# Patient Record
Sex: Female | Born: 1955
Health system: Southern US, Community
[De-identification: ages and names within clinical notes are randomized; demographics above are authoritative.]

## PROBLEM LIST (undated history)

## (undated) DIAGNOSIS — N8110 Cystocele, unspecified: Secondary | ICD-10-CM

## (undated) DIAGNOSIS — M1990 Unspecified osteoarthritis, unspecified site: Secondary | ICD-10-CM

## (undated) DIAGNOSIS — N393 Stress incontinence (female) (male): Secondary | ICD-10-CM

## (undated) DIAGNOSIS — Z973 Presence of spectacles and contact lenses: Secondary | ICD-10-CM

## (undated) DIAGNOSIS — N816 Rectocele: Secondary | ICD-10-CM

## (undated) HISTORY — PX: TONSILLECTOMY AND ADENOIDECTOMY: SUR1326

---

## 1999-11-15 ENCOUNTER — Other Ambulatory Visit: Admission: RE | Admit: 1999-11-15 | Discharge: 1999-11-15 | Payer: Self-pay | Admitting: Obstetrics and Gynecology

## 2001-01-13 ENCOUNTER — Other Ambulatory Visit: Admission: RE | Admit: 2001-01-13 | Discharge: 2001-01-13 | Payer: Self-pay | Admitting: Obstetrics and Gynecology

## 2002-01-14 ENCOUNTER — Other Ambulatory Visit: Admission: RE | Admit: 2002-01-14 | Discharge: 2002-01-14 | Payer: Self-pay | Admitting: Obstetrics and Gynecology

## 2003-02-10 ENCOUNTER — Other Ambulatory Visit: Admission: RE | Admit: 2003-02-10 | Discharge: 2003-02-10 | Payer: Self-pay | Admitting: Obstetrics and Gynecology

## 2004-03-07 ENCOUNTER — Other Ambulatory Visit: Admission: RE | Admit: 2004-03-07 | Discharge: 2004-03-07 | Payer: Self-pay | Admitting: Obstetrics and Gynecology

## 2009-03-10 DIAGNOSIS — I8390 Asymptomatic varicose veins of unspecified lower extremity: Secondary | ICD-10-CM

## 2009-03-10 HISTORY — PX: VARICOSE VEIN SURGERY: SHX832

## 2009-03-10 HISTORY — DX: Asymptomatic varicose veins of unspecified lower extremity: I83.90

## 2009-06-27 ENCOUNTER — Ambulatory Visit: Payer: Self-pay | Admitting: Vascular Surgery

## 2009-07-11 ENCOUNTER — Ambulatory Visit: Payer: Self-pay | Admitting: Vascular Surgery

## 2009-07-18 ENCOUNTER — Ambulatory Visit: Payer: Self-pay | Admitting: Vascular Surgery

## 2010-07-23 NOTE — Consult Note (Signed)
NEW PATIENT CONSULTATION   Hailey Smith, Hailey Smith  DOB:  08/06/1955                                       06/27/2009  ZOXWR#:[id number]   The patient presents today for evaluation of right leg venous  insufficiency and hypertension.  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  She works as a Psychologist, prison and probation services and stands for great  periods of time, which is difficult due to leg pain. She has had to  discontinue all exercise that requires a squatting position due to leg  pain. Hailey Smith also experiences severe leg pain when she drives  distances. She has to drive to [place], [place] to care for her father and  leg pain has made this situation difficult.  She has discomfort over  these varicosities, most particularly in the anterior thigh and her  lateral calf.  This is somewhat relieved with rest.  She had been seen  at an outlying vein center and had been compliant with graduated  compression stockings thigh high 30-40 mmHg for greater than 6 months.  She was out of network and we are seeing her for second opinion.  I also  have her prior duplex results and I reviewed these as well.  She does  have reflux in anterior branch of her right great saphenous vein that is  feeding directly into the varicosities.  I re-imaged this myself with  SonoSite and this does confirm the anterior saphenous branch feeding  into these varicosities.  She has had no bleeding from these.  She does  not have any history of deep venous thrombosis.  She reports that this  pain has been progressive.   PAST HISTORY:  Is negative for any major medical disorders, specifically  no hypertension or cardiac disease.   FAMILY HISTORY:  Negative for premature atherosclerotic disease.   SOCIAL HISTORY:  She is married with three children.  She does not smoke  or drink alcohol.   REVIEW OF SYSTEMS:  No weight loss or weight gain,  weight reported at  pounds 150 pounds, she is 5 feet 3 inches tall.  Denies cardiac,  pulmonary, GI, GU difficulties.  VASCULAR:  Positive only for her pain with her varicosities.  NEUROLOGIC:  MUSCULOSKELETAL:  PSYCHIATRIC:  Negative.  HEENT:  Positive for her being treated with glaucoma.  HEMATOLOGIC:  SKIN:  Negative.   PHYSICAL EXAM:  Well-developed, well-nourished white female appearing  stated age.  No acute distress.  Blood pressure is 132/88, pulse 74,  respirations 14.  HEENT is normal.  Her musculoskeletal shows no major  deformities or cyanosis.  Neurologic:  No focal weakness or  paresthesias.  Skin:  Without ulcers or rashes.  She does have a large  typical pattern of aberrant varicosities beginning in her medial groin  on the right extending across her anterior thigh to the lateral knee and  then down  the lateral calf.   I again discussed options with the patient and I have recommended laser  ablation of her anterior saphenous vein and stab phlebectomy of multiple  tributary varicosities.  I explained this as an outpatient procedure  under local anesthesia and she understands this and wishes to proceed.  She had been preapproved apparently in the past, although this was out  of network expense, therefore we  will assure insurance coverage and then  proceed at her convenience.     Larina Earthly, M.D.  Electronically Signed   TFE/MEDQ  D:  06/27/2009  T:  06/28/2009  Job:  [id number]   cc:   [facility] Carily[facility]past[facility]

## 2010-07-23 NOTE — Assessment & Plan Note (Signed)
OFFICE VISIT   Hailey Smith, Hailey Smith  DOB:  1956-02-16                                       07/11/2009  ZOXWR#:[id number]   The patient today underwent laser ablation of right anterior saphenous  vein from mid thigh to her groin and stab phlebectomy of multiple  tributary varicosities from her thigh, the lateral knee and calf.  She  had no immediate complication.  This was done under local anesthesia.  She will be seen again in 1 week for continued followup and repeat  ultrasound.     Larina Earthly, M.D.  Electronically Signed   TFE/MEDQ  D:  07/11/2009  T:  07/12/2009  Job:  [id number]

## 2010-07-23 NOTE — Assessment & Plan Note (Signed)
OFFICE VISIT   SEFORA, TIETJE  DOB:  Mar 01, 1956                                       07/18/2009  NFAOZ#:[id number]   Hailey Smith presents today for 1-week follow-up of her laser ablation  of anterior branch of her great saphenous vein on the right and stab  phlebectomy of multiple tributaries varicosities throughout her thigh,  lateral knee area and calf area.  She has excellent initial result with  minimal if any discomfort throughout the course of her procedure.  She  does have the usual amount of mild bruising over the phlebectomy sites.  She underwent repeat venous duplex today and this shows no evidence of  the injury to the deep veins.  Her anterior saphenous branch in his  occluded to the saphenofemoral junction.  I am quite pleased with her  initial result as is [name].  She will continue usual activities,  wear compression garments for 1 more week and will see [place] again on an as-  needed basis.     Larina Earthly, M.D.  Electronically Signed   TFE/MEDQ  D:  07/18/2009  T:  07/19/2009  Job:  [id number]

## 2010-07-23 NOTE — Procedures (Signed)
DUPLEX DEEP VENOUS EXAM - LOWER EXTREMITY   INDICATION:  One week post right anterolateral laser with phlebectomies.   HISTORY:  Edema:  No.  Trauma/Surgery:  One week post anterolateral tributary laser with  phlebectomies.  Pain:  No.  PE:  No.  Previous DVT:  No.  Anticoagulants:  No.  Other:   DUPLEX EXAM:                CFV   SFV   PopV  PTV    GSV                R  L  R  L  R  L  R   L  R  L  Thrombosis    o  o  o     o     o      o  Spontaneous   +  +  +     +     +      +  Phasic        +  +  +     +     +      +  Augmentation  +  +  +     +     +      +  Compressible  +  +  +     +     +      +  Competent     +  +  +     +     +      +   Legend:  + - yes  o - no  p - partial  D - decreased   IMPRESSION:  1. The right leg is negative for deep venous thrombosis.  2. The right anterolateral tributary is closed off and the area of APs      are also closed off due to laser procedure.    _____________________________  Larina Earthly, M.D.   NT/MEDQ  D:  07/18/2009  T:  07/18/2009  Job:  [id number]

## 2013-11-21 ENCOUNTER — Encounter: Payer: Self-pay | Admitting: Podiatry

## 2013-11-21 ENCOUNTER — Ambulatory Visit (INDEPENDENT_AMBULATORY_CARE_PROVIDER_SITE_OTHER): Payer: BC Managed Care – PPO | Admitting: Podiatry

## 2013-11-21 VITALS — BP 117/75 | HR 89 | Resp 18

## 2013-11-21 DIAGNOSIS — Q828 Other specified congenital malformations of skin: Secondary | ICD-10-CM

## 2013-11-21 NOTE — Progress Notes (Signed)
   Subjective:    Patient ID: Hailey Smith, female    DOB: [birth year], 58 y.o.   MRN: [id number]  HPI DR [facility] TRIMS ON MY CALLUSES AND THEY ARE SORE AND TENDER AND THEY HURT WHEN I WALK AND THERE IS NO BURNING AND THROBBING. This patient presents for ongoing care for painful plantar keratoses. She has been a patient in this practice since 10/25/1999. The last visit for the service was 05/19/2012.   Review of Systems  Allergic/Immunologic: Positive for environmental allergies.  All other systems reviewed and are negative.      Objective:   Physical Exam Orientated x3 white female  Nucleated keratoses more prominent plantar right and left x4        Assessment & Plan:   Assessment: Porokeratosis x4  Plan: Keratoses are debrided in the plantar left packed with salinocaine  Reappoint at patient's request

## 2015-07-17 DIAGNOSIS — R300 Dysuria: Secondary | ICD-10-CM | POA: Diagnosis not present

## 2015-09-04 DIAGNOSIS — H524 Presbyopia: Secondary | ICD-10-CM | POA: Diagnosis not present

## 2015-12-28 DIAGNOSIS — J208 Acute bronchitis due to other specified organisms: Secondary | ICD-10-CM | POA: Diagnosis not present

## 2016-01-07 DIAGNOSIS — J209 Acute bronchitis, unspecified: Secondary | ICD-10-CM | POA: Diagnosis not present

## 2016-01-11 ENCOUNTER — Ambulatory Visit
Admission: RE | Admit: 2016-01-11 | Discharge: 2016-01-11 | Disposition: A | Discharge status: Home / Self Care | Payer: BLUE CROSS/BLUE SHIELD | Source: Ambulatory Visit | Attending: Family Medicine | Admitting: Family Medicine

## 2016-01-11 ENCOUNTER — Other Ambulatory Visit: Payer: Self-pay | Admitting: Family Medicine

## 2016-01-11 DIAGNOSIS — R05 Cough: Secondary | ICD-10-CM | POA: Diagnosis not present

## 2016-01-11 DIAGNOSIS — R059 Cough, unspecified: Secondary | ICD-10-CM

## 2016-01-11 DIAGNOSIS — R062 Wheezing: Secondary | ICD-10-CM | POA: Diagnosis not present

## 2016-01-23 DIAGNOSIS — Z130 Encounter for screening for diseases of the blood and blood-forming organs and certain disorders involving the immune mechanism: Secondary | ICD-10-CM | POA: Diagnosis not present

## 2016-01-23 DIAGNOSIS — Z01419 Encounter for gynecological examination (general) (routine) without abnormal findings: Secondary | ICD-10-CM | POA: Diagnosis not present

## 2016-01-23 DIAGNOSIS — Z23 Encounter for immunization: Secondary | ICD-10-CM | POA: Diagnosis not present

## 2016-01-23 DIAGNOSIS — Z1389 Encounter for screening for other disorder: Secondary | ICD-10-CM | POA: Diagnosis not present

## 2016-01-23 DIAGNOSIS — N393 Stress incontinence (female) (male): Secondary | ICD-10-CM | POA: Diagnosis not present

## 2016-01-23 DIAGNOSIS — Z6829 Body mass index (BMI) 29.0-29.9, adult: Secondary | ICD-10-CM | POA: Diagnosis not present

## 2016-01-23 DIAGNOSIS — Z1231 Encounter for screening mammogram for malignant neoplasm of breast: Secondary | ICD-10-CM | POA: Diagnosis not present

## 2016-01-23 DIAGNOSIS — R1904 Left lower quadrant abdominal swelling, mass and lump: Secondary | ICD-10-CM | POA: Diagnosis not present

## 2016-01-28 ENCOUNTER — Other Ambulatory Visit: Payer: Self-pay | Admitting: Obstetrics and Gynecology

## 2016-01-28 DIAGNOSIS — R1904 Left lower quadrant abdominal swelling, mass and lump: Secondary | ICD-10-CM

## 2016-02-04 ENCOUNTER — Ambulatory Visit
Admission: RE | Admit: 2016-02-04 | Discharge: 2016-02-04 | Disposition: A | Discharge status: Home / Self Care | Payer: BLUE CROSS/BLUE SHIELD | Source: Ambulatory Visit | Attending: Obstetrics and Gynecology | Admitting: Obstetrics and Gynecology

## 2016-02-04 ENCOUNTER — Other Ambulatory Visit: Payer: Self-pay | Admitting: Family Medicine

## 2016-02-04 DIAGNOSIS — R1904 Left lower quadrant abdominal swelling, mass and lump: Secondary | ICD-10-CM

## 2016-02-04 DIAGNOSIS — R1909 Other intra-abdominal and pelvic swelling, mass and lump: Secondary | ICD-10-CM | POA: Diagnosis not present

## 2016-02-06 ENCOUNTER — Other Ambulatory Visit: Payer: Self-pay | Admitting: Family Medicine

## 2016-02-06 ENCOUNTER — Ambulatory Visit
Admission: RE | Admit: 2016-02-06 | Discharge: 2016-02-06 | Disposition: A | Discharge status: Home / Self Care | Payer: BLUE CROSS/BLUE SHIELD | Source: Ambulatory Visit | Attending: Family Medicine | Admitting: Family Medicine

## 2016-02-06 DIAGNOSIS — J189 Pneumonia, unspecified organism: Secondary | ICD-10-CM | POA: Diagnosis not present

## 2016-02-06 DIAGNOSIS — R059 Cough, unspecified: Secondary | ICD-10-CM

## 2016-02-06 DIAGNOSIS — R05 Cough: Secondary | ICD-10-CM

## 2016-03-14 ENCOUNTER — Ambulatory Visit
Admission: RE | Admit: 2016-03-14 | Discharge: 2016-03-14 | Disposition: A | Discharge status: Home / Self Care | Payer: BLUE CROSS/BLUE SHIELD | Source: Ambulatory Visit | Attending: Family Medicine | Admitting: Family Medicine

## 2016-03-14 ENCOUNTER — Other Ambulatory Visit: Payer: Self-pay | Admitting: Family Medicine

## 2016-03-14 DIAGNOSIS — J40 Bronchitis, not specified as acute or chronic: Secondary | ICD-10-CM

## 2016-03-14 DIAGNOSIS — R05 Cough: Secondary | ICD-10-CM | POA: Diagnosis not present

## 2016-05-23 DIAGNOSIS — J069 Acute upper respiratory infection, unspecified: Secondary | ICD-10-CM | POA: Diagnosis not present

## 2016-09-17 DIAGNOSIS — H1045 Other chronic allergic conjunctivitis: Secondary | ICD-10-CM | POA: Diagnosis not present

## 2016-12-11 DIAGNOSIS — Z23 Encounter for immunization: Secondary | ICD-10-CM | POA: Diagnosis not present

## 2017-02-11 DIAGNOSIS — Z6830 Body mass index (BMI) 30.0-30.9, adult: Secondary | ICD-10-CM | POA: Diagnosis not present

## 2017-02-11 DIAGNOSIS — N951 Menopausal and female climacteric states: Secondary | ICD-10-CM | POA: Diagnosis not present

## 2017-02-11 DIAGNOSIS — Z01419 Encounter for gynecological examination (general) (routine) without abnormal findings: Secondary | ICD-10-CM | POA: Diagnosis not present

## 2017-02-11 DIAGNOSIS — Z1231 Encounter for screening mammogram for malignant neoplasm of breast: Secondary | ICD-10-CM | POA: Diagnosis not present

## 2017-02-11 DIAGNOSIS — Z130 Encounter for screening for diseases of the blood and blood-forming organs and certain disorders involving the immune mechanism: Secondary | ICD-10-CM | POA: Diagnosis not present

## 2017-02-11 DIAGNOSIS — R3129 Other microscopic hematuria: Secondary | ICD-10-CM | POA: Diagnosis not present

## 2017-02-11 DIAGNOSIS — Z1389 Encounter for screening for other disorder: Secondary | ICD-10-CM | POA: Diagnosis not present

## 2017-02-13 DIAGNOSIS — R3129 Other microscopic hematuria: Secondary | ICD-10-CM | POA: Diagnosis not present

## 2017-05-20 DIAGNOSIS — J209 Acute bronchitis, unspecified: Secondary | ICD-10-CM | POA: Diagnosis not present

## 2017-06-08 DIAGNOSIS — J209 Acute bronchitis, unspecified: Secondary | ICD-10-CM | POA: Diagnosis not present

## 2017-06-08 DIAGNOSIS — R030 Elevated blood-pressure reading, without diagnosis of hypertension: Secondary | ICD-10-CM | POA: Diagnosis not present

## 2017-06-25 ENCOUNTER — Encounter: Payer: Self-pay | Admitting: Podiatry

## 2017-06-25 ENCOUNTER — Ambulatory Visit: Payer: BLUE CROSS/BLUE SHIELD

## 2017-06-25 ENCOUNTER — Ambulatory Visit: Payer: BLUE CROSS/BLUE SHIELD | Admitting: Podiatry

## 2017-06-25 DIAGNOSIS — S90852A Superficial foreign body, left foot, initial encounter: Secondary | ICD-10-CM | POA: Diagnosis not present

## 2017-06-25 DIAGNOSIS — N951 Menopausal and female climacteric states: Secondary | ICD-10-CM | POA: Insufficient documentation

## 2017-06-25 DIAGNOSIS — R319 Hematuria, unspecified: Secondary | ICD-10-CM | POA: Insufficient documentation

## 2017-06-25 DIAGNOSIS — R300 Dysuria: Secondary | ICD-10-CM | POA: Insufficient documentation

## 2017-06-25 DIAGNOSIS — B351 Tinea unguium: Secondary | ICD-10-CM | POA: Diagnosis not present

## 2017-06-25 MED ORDER — TERBINAFINE HCL 250 MG PO TABS: 250.0000 mg | ORAL_TABLET | Freq: Every day | ORAL | 0 refills | Status: DC

## 2017-06-25 NOTE — Progress Notes (Signed)
Subjective:   Patient ID: Hailey Smith, female   DOB: 62 y.o.   MRN: [id number]   HPI Patient presents with discoloration of the hallux nails bilateral and a small area of trauma in the left plantar heel.  Patient does not smoke likes to be active and states the nails are discolored over the last year   Review of Systems  All other systems reviewed and are negative.       Objective:  Physical Exam  Constitutional: She appears well-developed and well-nourished.  Cardiovascular: Intact distal pulses.  Pulmonary/Chest: Effort normal.  Musculoskeletal: Normal range of motion.  Neurological: She is alert.  Skin: Skin is warm.  Nursing note and vitals reviewed.   Neurovascular status intact muscle strength is adequate range of motion within normal limits with patient found to have moderate discoloration of the distal two thirds of the nailbed right and one third distal nailbed left with a small trauma spot left heel that is localized with no erythema edema or drainage noted     Assessment:  Probability for moderate mycotic nail infection of the hallux bilateral with traumatized left heel with no indications of infection     Plan:  H&P conditions reviewed and at this point I recommended topical medicines laser and oral medicine and explained the risk of oral and I am ordering liver function study.  Patient is placed on oral medicine and Lamisil 250 for 90 days and is scheduled for her first laser will have 3-4 laser treatments.  Hallux nails bilateral

## 2017-06-29 ENCOUNTER — Ambulatory Visit: Payer: Self-pay

## 2017-06-29 DIAGNOSIS — B351 Tinea unguium: Secondary | ICD-10-CM | POA: Diagnosis not present

## 2017-06-29 LAB — HEPATIC FUNCTION PANEL
AG Ratio: 1.6 (calc) (ref 1.0–2.5)
ALT: 13 U/L (ref 6–29)
AST: 17 U/L (ref 10–35)
Albumin: 4.5 g/dL (ref 3.6–5.1)
Alkaline phosphatase (APISO): 59 U/L (ref 33–130)
Bilirubin, Direct: 0.2 mg/dL (ref 0.0–0.2)
Globulin: 2.9 g/dL (calc) (ref 1.9–3.7)
Indirect Bilirubin: 0.4 mg/dL (calc) (ref 0.2–1.2)
Total Bilirubin: 0.6 mg/dL (ref 0.2–1.2)
Total Protein: 7.4 g/dL (ref 6.1–8.1)

## 2017-07-07 NOTE — Progress Notes (Signed)
Pt presents with mycotic infection of nails 1-5 bilateral  All other systems are negative  Laser therapy administered to affected nails and tolerated well. All safety precautions were in place. RE-appointed in 4 weeks for 2nd treatment 

## 2017-08-04 ENCOUNTER — Ambulatory Visit: Payer: Self-pay

## 2017-08-04 DIAGNOSIS — B351 Tinea unguium: Secondary | ICD-10-CM

## 2017-08-11 NOTE — Progress Notes (Signed)
Pt presents with mycotic infection of nails 1-5 bilateral  All other systems are negative  Laser therapy administered to affected nails and tolerated well. All safety precautions were in place. RE-appointed in 4 weeks for 3rd treatment 

## 2017-08-18 ENCOUNTER — Telehealth: Payer: Self-pay | Admitting: Podiatry

## 2017-08-18 NOTE — Telephone Encounter (Signed)
I'm being treated for toenail fungus and was prescribed a 3 month supply of the terbinafine. I've only taken 50 pills and within the last week or so I've started experiencing a rash. I know that can be a side affect. Its not horrible but it does itch. Also, I wanted to know the protocol for was for another blood test for liver functions because I know that's a concern. My best call back number is [phone].

## 2017-08-18 NOTE — Telephone Encounter (Signed)
I informed pt the hives was more of an allergy type reaction than an indicator of liver problem, to stop the lamisil and I would call again with other options.

## 2017-08-19 NOTE — Telephone Encounter (Signed)
Consider 1 diflucan a week for 8 weeks

## 2017-08-20 NOTE — Telephone Encounter (Signed)
I informed pt of Dr. Beverlee Nimsegal's change to Diflucan recommendation. Pt states she has another appt for laser and would like to see how the nails look at that time. I told pt that was fine, to tell Shanda BumpsJessica the other nurse and she would be able to send the orders to her pharmacy or inform me. Pt states understanding.

## 2017-08-20 NOTE — Telephone Encounter (Signed)
Left message on pt's mobile phone to call for information concerning change of medication.

## 2017-09-04 ENCOUNTER — Other Ambulatory Visit: Payer: BLUE CROSS/BLUE SHIELD

## 2017-10-08 DIAGNOSIS — R300 Dysuria: Secondary | ICD-10-CM | POA: Diagnosis not present

## 2017-10-09 ENCOUNTER — Other Ambulatory Visit: Payer: BLUE CROSS/BLUE SHIELD

## 2017-10-09 DIAGNOSIS — R300 Dysuria: Secondary | ICD-10-CM | POA: Diagnosis not present

## 2017-12-03 DIAGNOSIS — Z23 Encounter for immunization: Secondary | ICD-10-CM | POA: Diagnosis not present

## 2017-12-07 DIAGNOSIS — H2513 Age-related nuclear cataract, bilateral: Secondary | ICD-10-CM | POA: Diagnosis not present

## 2017-12-07 DIAGNOSIS — H1045 Other chronic allergic conjunctivitis: Secondary | ICD-10-CM | POA: Diagnosis not present

## 2018-02-16 DIAGNOSIS — Z1231 Encounter for screening mammogram for malignant neoplasm of breast: Secondary | ICD-10-CM | POA: Diagnosis not present

## 2018-02-16 DIAGNOSIS — Z130 Encounter for screening for diseases of the blood and blood-forming organs and certain disorders involving the immune mechanism: Secondary | ICD-10-CM | POA: Diagnosis not present

## 2018-02-16 DIAGNOSIS — Z1389 Encounter for screening for other disorder: Secondary | ICD-10-CM | POA: Diagnosis not present

## 2018-02-16 DIAGNOSIS — Z6830 Body mass index (BMI) 30.0-30.9, adult: Secondary | ICD-10-CM | POA: Diagnosis not present

## 2018-02-16 DIAGNOSIS — Z01419 Encounter for gynecological examination (general) (routine) without abnormal findings: Secondary | ICD-10-CM | POA: Diagnosis not present

## 2018-03-10 HISTORY — PX: CATARACT EXTRACTION W/ INTRAOCULAR LENS IMPLANT: SHX1309

## 2018-03-10 HISTORY — PX: EYE SURGERY: SHX253

## 2018-03-23 DIAGNOSIS — C44311 Basal cell carcinoma of skin of nose: Secondary | ICD-10-CM | POA: Diagnosis not present

## 2018-10-06 IMAGING — US US PELVIS LIMITED
1 series · 13 of 13 positions shown · non-contrast
Comparison: None.

CLINICAL DATA: Left groin mass

EXAM:
ULTRASOUND OF BILATERAL GROIN SOFT TISSUES
TECHNIQUE: Ultrasound examination of the groin soft tissues was performed in
the area of clinical concern.

[Series 1: us pelvis limited · 0.08mm/px · 13 of 13 slices shown]
[im 1/13]
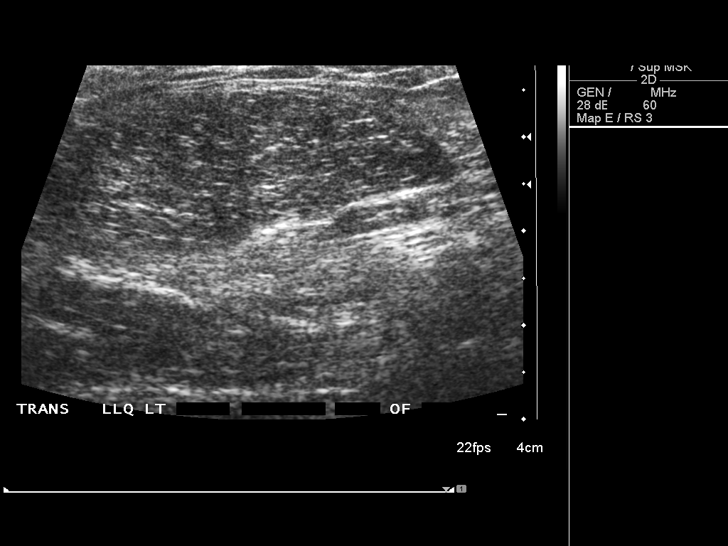
[im 2/13]
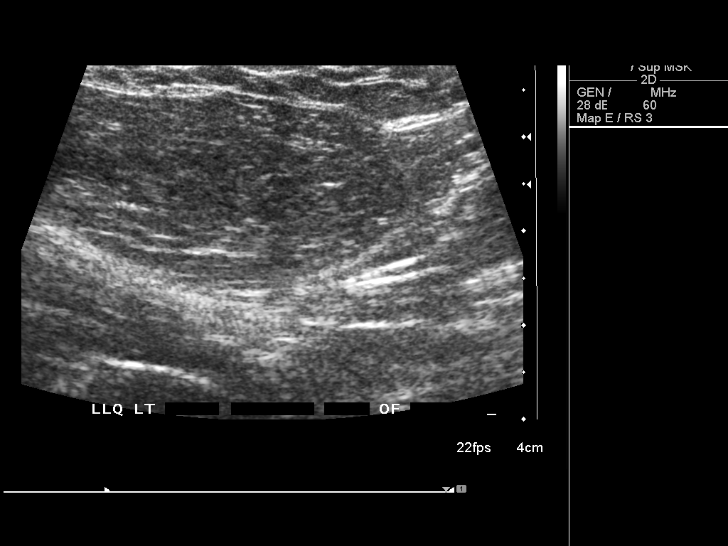
[im 3/13]
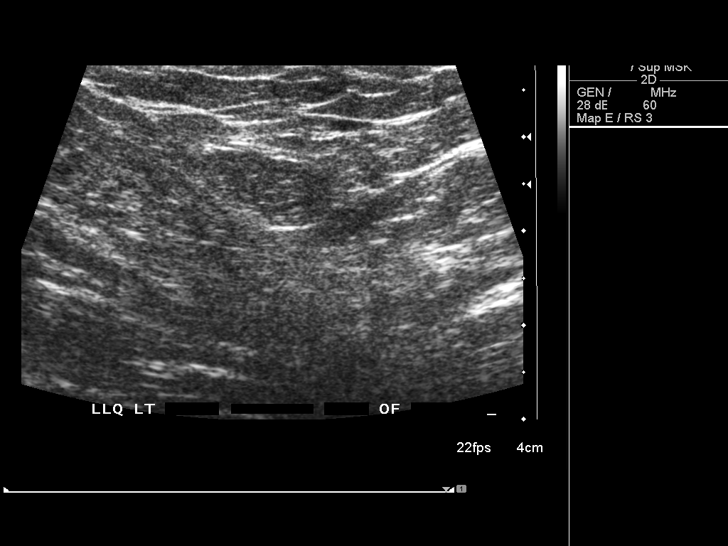
[im 4/13]
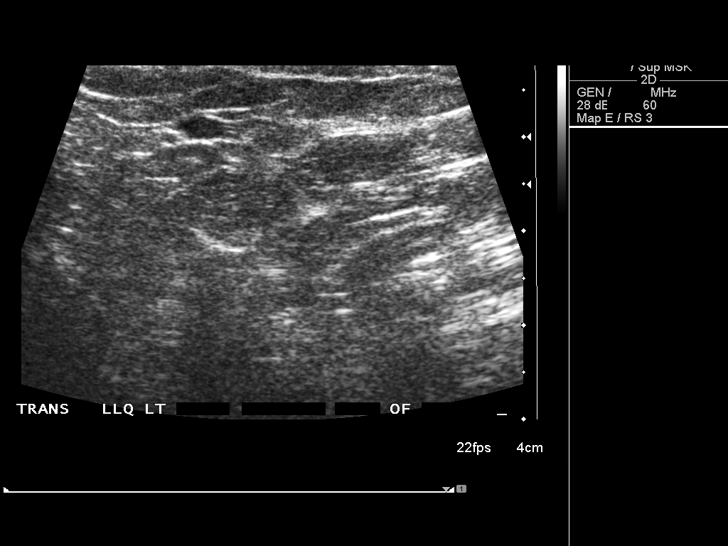
[im 5/13]
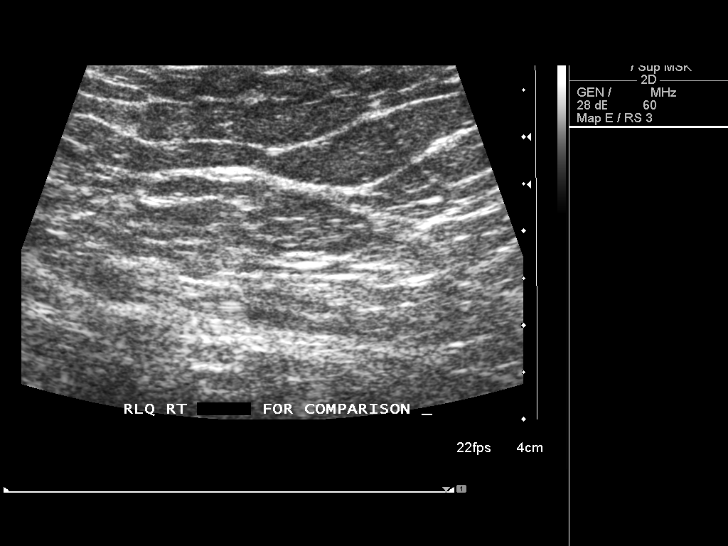
[im 6/13]
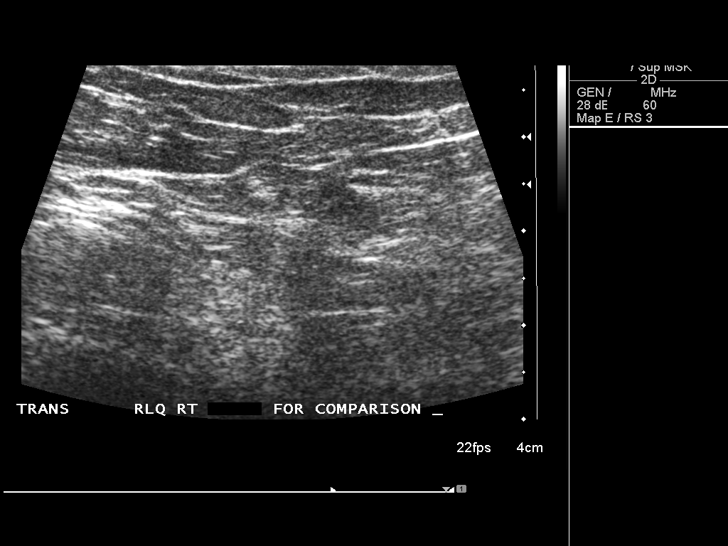
[im 7/13]
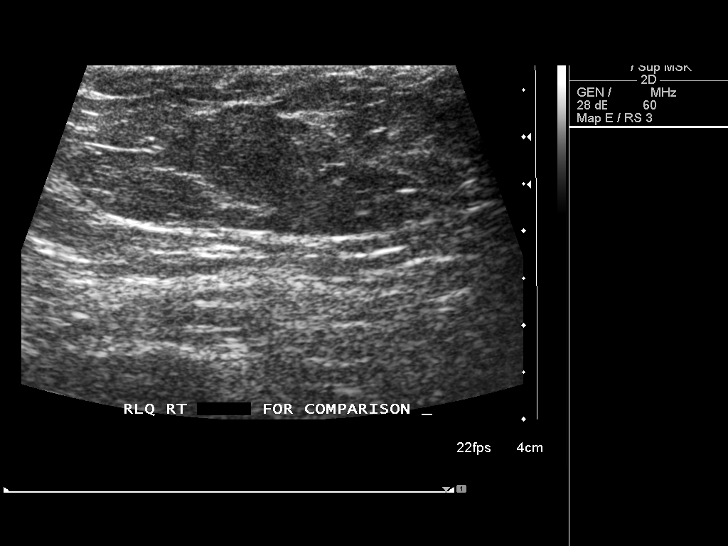
[im 8/13]
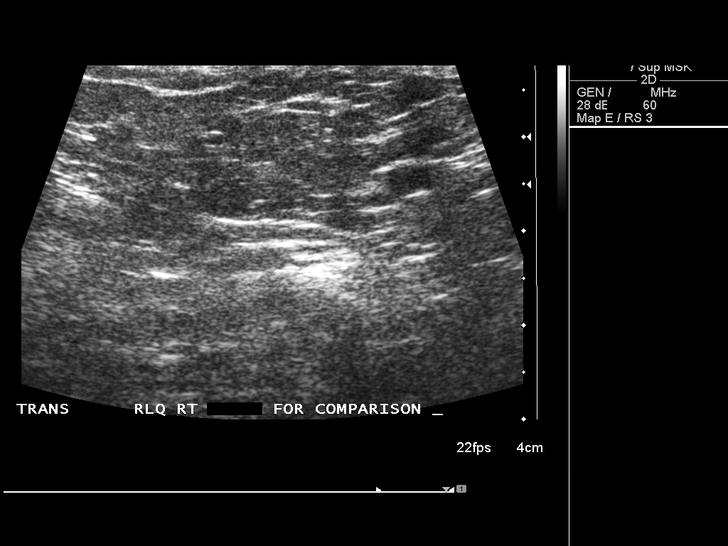
[im 9/13]
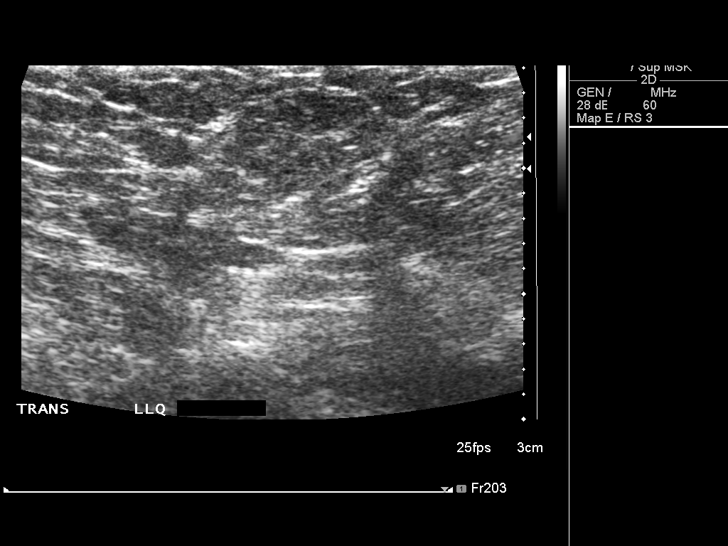
[im 10/13]
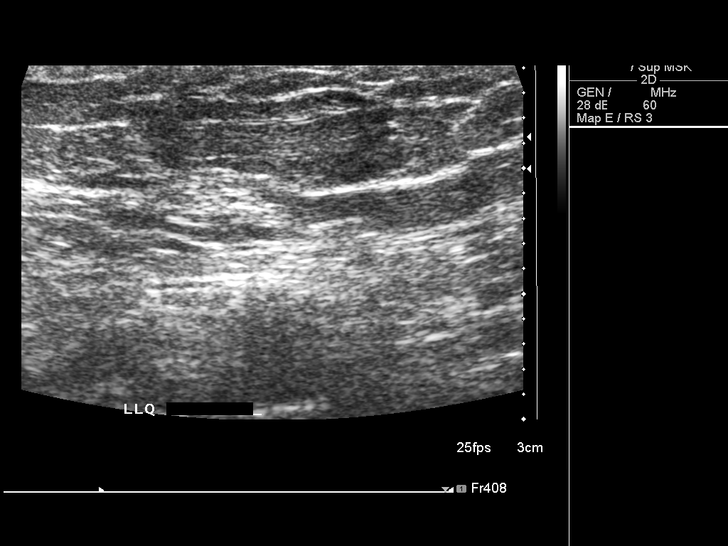
[im 11/13]
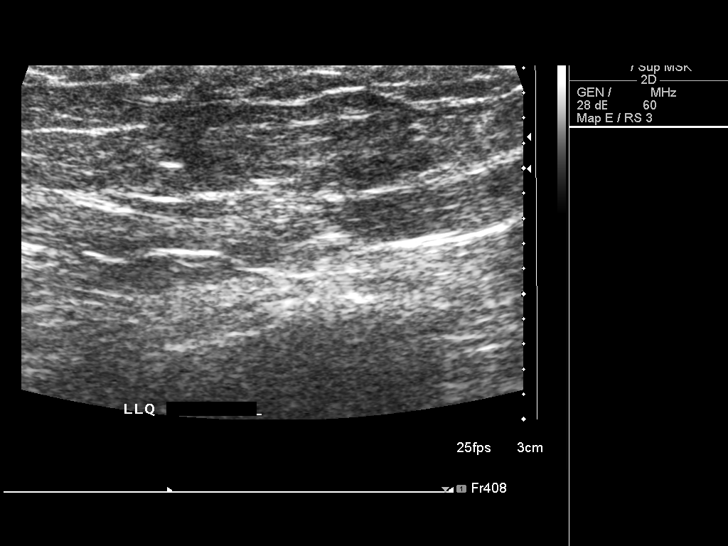
[im 12/13]
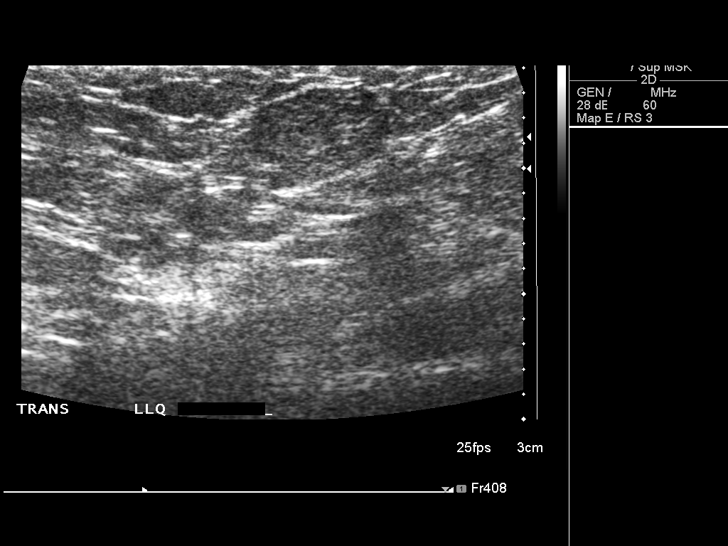
[im 13/13]
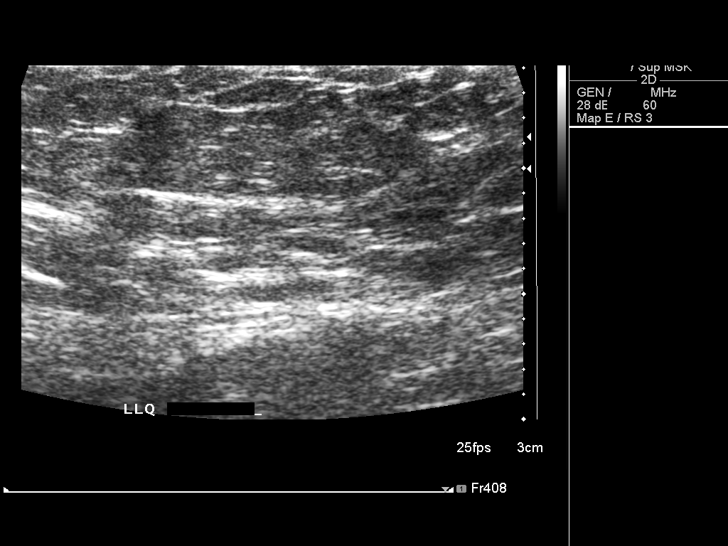

[13 of 13 positions shown; findings below may reference images not displayed]

FINDINGS: Sonographic imaging of the left groin was performed. No discrete
mass or fluid collection is noted. There is no lymphadenopathy. The
contralateral right groin was also imaged for comparison and is
similar in appearance.
IMPRESSION: No discrete mass or fluid collection in the left groin. No
lymphadenopathy.

Unremarkable appearance of the right groin imaged for comparison.

## 2018-10-08 IMAGING — CR DG CHEST W/ LORDOTIC
3 series · 3 of 3 positions shown · non-contrast
Comparison: 01/11/2016 .

CLINICAL DATA: Pneumonia .

EXAM:
CHEST - 1 VIEW WITH LORDOTIC VIEW(S)

[w chest pa (1 of 2)]
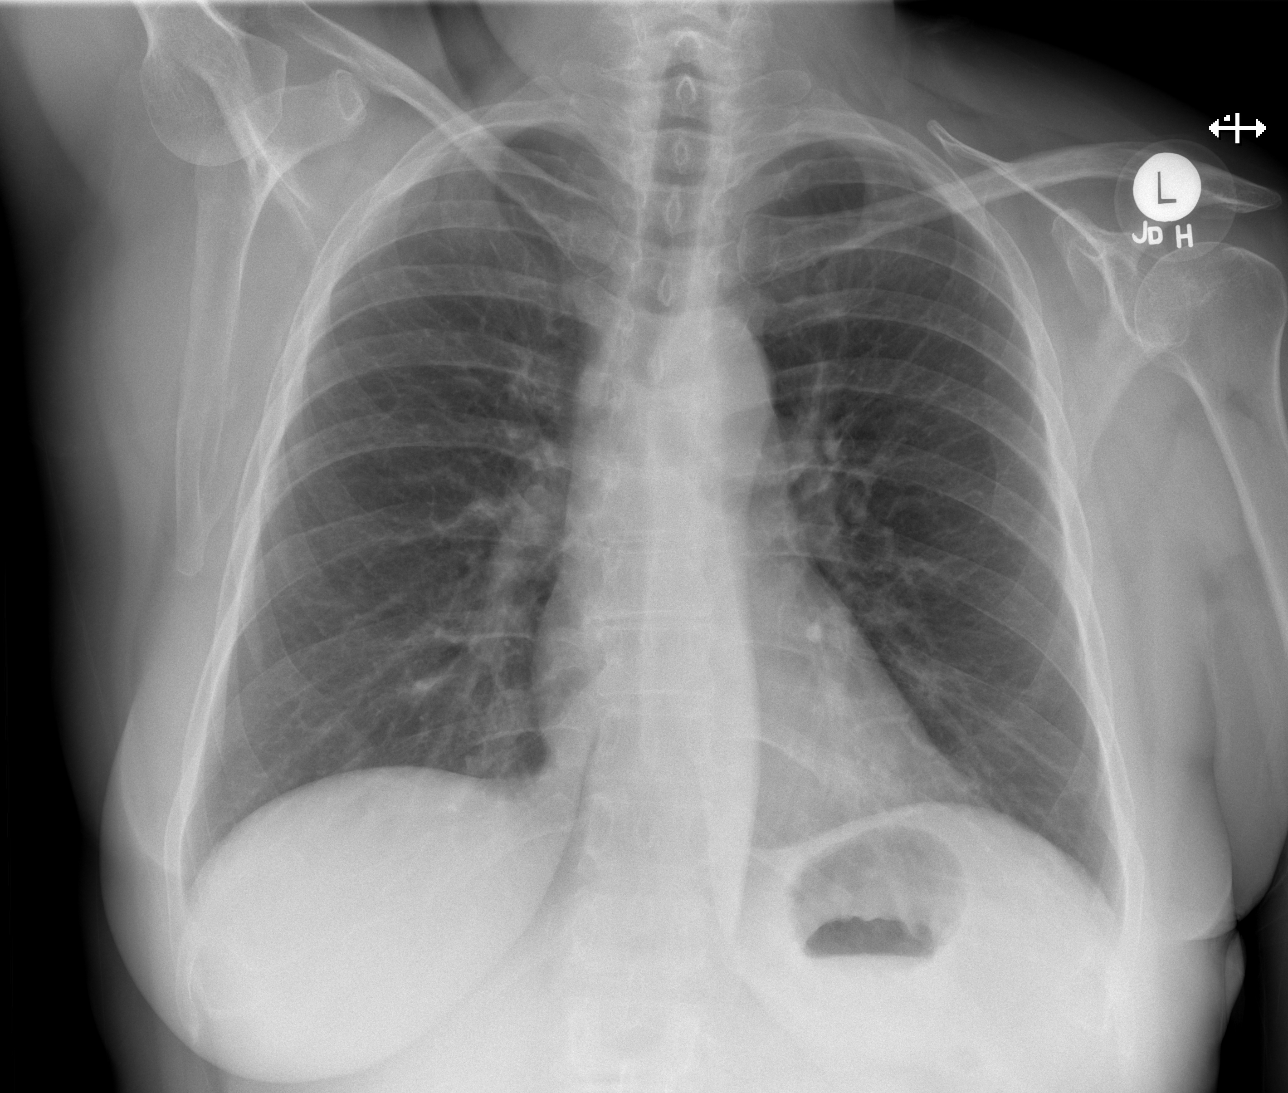

[w chest lat]
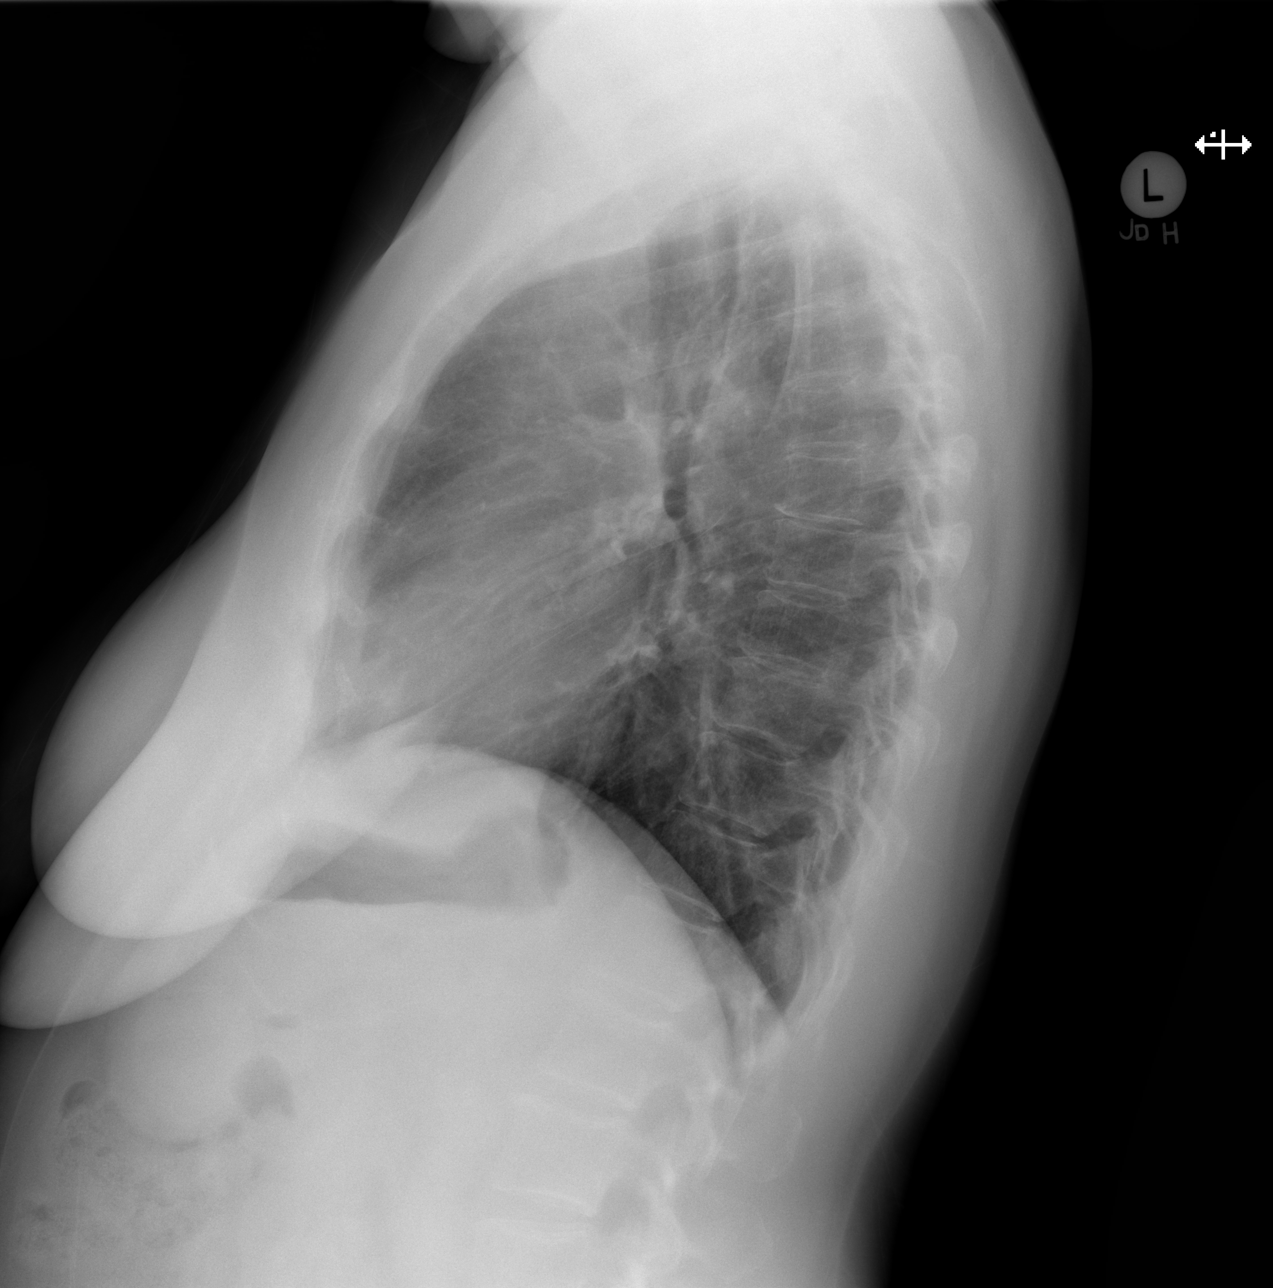

[w chest pa (2 of 2)]
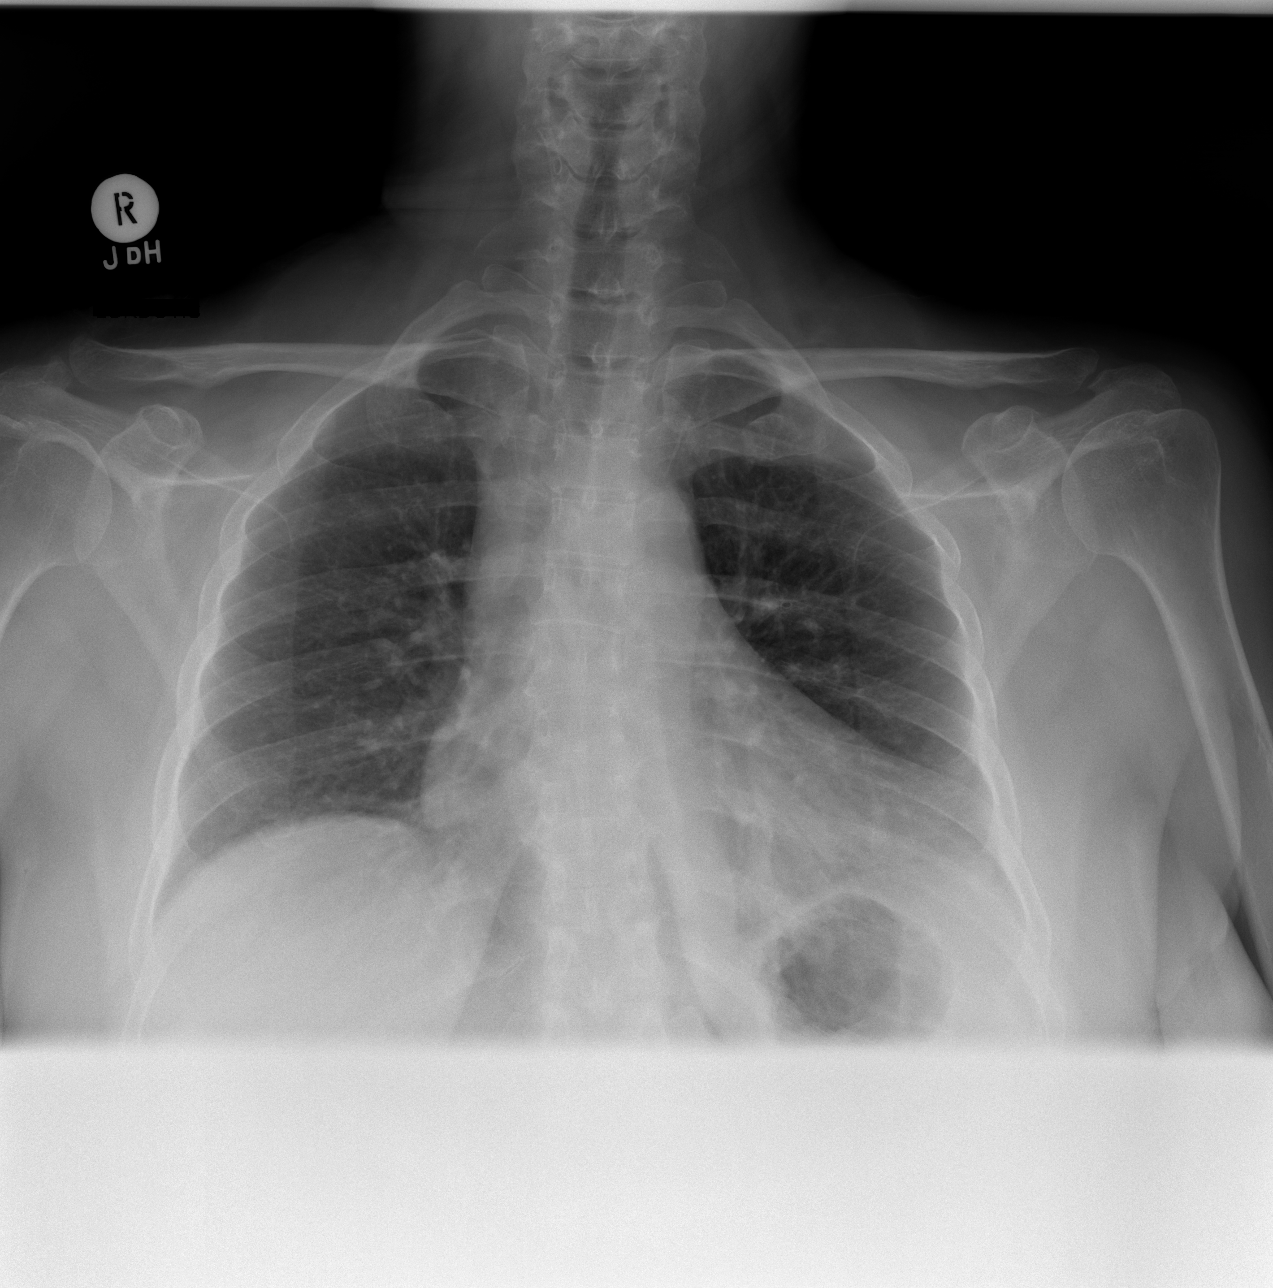

[3 of 3 positions shown; findings below may reference images not displayed]

FINDINGS: Mediastinum and hilar structures normal. Partial clearing of right
suprahilar density consistent with clearing atelectasis and/or
pneumonia. Continued follow-up chest x-rays recommended to
demonstrate complete clearing. Heart size normal. No pleural
effusion or pneumothorax. No acute bony abnormality.
IMPRESSION: Interim near complete clearing of right suprahilar density
consistent with clearing atelectasis and/or pneumonia. Continued
follow-up chest x-rays recommended to demonstrate complete clearing.

## 2020-07-08 DIAGNOSIS — Z8616 Personal history of COVID-19: Secondary | ICD-10-CM

## 2020-07-08 HISTORY — DX: Personal history of COVID-19: Z86.16

## 2021-05-03 ENCOUNTER — Other Ambulatory Visit: Payer: Self-pay

## 2021-05-03 ENCOUNTER — Encounter (HOSPITAL_BASED_OUTPATIENT_CLINIC_OR_DEPARTMENT_OTHER): Payer: Self-pay | Admitting: Obstetrics and Gynecology

## 2021-05-03 NOTE — Progress Notes (Signed)
Spoke w/ via phone for pre-op interview--- pt Lab needs dos----  cbc             Lab results------ no COVID test -----patient states asymptomatic no test needed Arrive at ------- 0530 on 05-09-2021 NPO after MN NO Solid Food.  Clear liquids from MN until--- 0430 Med rec completed Medications to take morning of surgery ----- none Diabetic medication ----- n/a Patient instructed no nail polish to be worn day of surgery Patient instructed to bring photo id and insurance card day of surgery Patient aware to have Driver (ride ) / caregiver for 24 hours after surgery -- husband, Hailey Smith Patient Special Instructions ----- reviewed RCC and visitor guidelines Pre-Op special Istructions ----- n/a Patient verbalized understanding of instructions that were given at this phone interview. Patient denies shortness of breath, chest pain, fever, cough at this phone interview.

## 2021-05-08 NOTE — H&P (Signed)
Hailey Smith is an 66 y.o. female. She was seen for her annual exam in January, menopausal, doing well overall.  Having more pelvic pressure from vaginal bulge, difficult to get and stay clean after BM.  Sexually active and wants to maintain ability to have coitus.  On exam, Gr I-II rectocele, Gr I cystocele.   ? ?Pertinent Gynecological History: ?Last mammogram: normal Date: 03/2021 ?Last pap:  ASCUS, neg HPV  Date: 02/2019 ?OB History: G3, P3003 ?SVD x 3  ? ?Menstrual History: ?No LMP recorded. Patient is postmenopausal. ?  ? ?Past Medical History:  ?Diagnosis Date  ? Cystocele with rectocele   ? History of COVID-19 07/2020  ? per pt mild symptoms that resolved  ? OA (osteoarthritis)   ? fingers  ? SUI (stress urinary incontinence, female)   ? Varicosities of leg 2011  ? w/ venous insuffeniency;  s/p laser ablation GSV and stab phlebectomy of right thigh, knee area, calf in 2011  ? Wears contact lenses   ? left eye only  ? ? ?Past Surgical History:  ?Procedure Laterality Date  ? CATARACT EXTRACTION W/ INTRAOCULAR LENS IMPLANT Right 2020  ? EYE SURGERY Right 2020  ? virtrectomy post cataracty surgery ,  ? TONSILLECTOMY AND ADENOIDECTOMY    ? child  ? VARICOSE VEIN SURGERY Right 2011  ? by Dr Arbie Cookey;   laser ablation GSV and stab phlebectomy's of right thigh, knee, and calf area's  ? ? ?History reviewed. No pertinent family history. ? ?Social History:  reports that she has never smoked. She has never used smokeless tobacco. She reports that she does not drink alcohol and does not use drugs. ? ?Allergies:  ?Allergies  ?Allergen Reactions  ? Sulfa Antibiotics Other (See Comments)  ?  headache  ? ? ?No medications prior to admission.  ? ? ?Review of Systems  ?Respiratory: Negative.    ?Cardiovascular: Negative.   ? ?Height 5\' 3"  (1.6 m), weight 68 kg. ?Physical Exam ?Constitutional:   ?   Appearance: Normal appearance.  ?Cardiovascular:  ?   Rate and Rhythm: Normal rate and regular rhythm.  ?   Heart sounds: Normal  heart sounds. No murmur heard. ?Pulmonary:  ?   Effort: Pulmonary effort is normal. No respiratory distress.  ?   Breath sounds: Normal breath sounds.  ?Abdominal:  ?   General: There is no distension.  ?   Palpations: Abdomen is soft. There is no mass.  ?   Tenderness: There is no abdominal tenderness.  ?Genitourinary: ?   General: Normal vulva.  ?   Comments: Uterus normal size and well supported ?No adnexal mass ?Gr I cystocele, Gr I-II rectocele ?Musculoskeletal:  ?   Cervical back: Neck supple.  ?Neurological:  ?   Mental Status: She is alert.  ? ? ?No results found for this or any previous visit (from the past 24 hour(s)). ? ?No results found. ? ?Assessment/Plan: ?Symptomatic cystocele and rectocele.  Medical and surgical options discussed, including pessary.  She prefers surgical repair, sexually active.  Surgical procedure and risks discussed, chances of relieving symptoms discussed, questions answered.  Will admit for A&P repair ? ? Latonyia Lopata ?05/08/2021, 7:20 PM ? ?

## 2021-05-09 ENCOUNTER — Encounter (HOSPITAL_BASED_OUTPATIENT_CLINIC_OR_DEPARTMENT_OTHER): Payer: Self-pay | Admitting: Obstetrics and Gynecology

## 2021-05-09 ENCOUNTER — Other Ambulatory Visit: Payer: Self-pay

## 2021-05-09 ENCOUNTER — Ambulatory Visit (HOSPITAL_BASED_OUTPATIENT_CLINIC_OR_DEPARTMENT_OTHER): Payer: Commercial Managed Care - HMO | Admitting: Certified Registered"

## 2021-05-09 ENCOUNTER — Ambulatory Visit (HOSPITAL_BASED_OUTPATIENT_CLINIC_OR_DEPARTMENT_OTHER)
Admission: RE | Admit: 2021-05-09 | Discharge: 2021-05-10 | Disposition: A | Discharge status: Home / Self Care | Payer: Commercial Managed Care - HMO | Attending: Obstetrics and Gynecology | Admitting: Obstetrics and Gynecology

## 2021-05-09 ENCOUNTER — Encounter (HOSPITAL_BASED_OUTPATIENT_CLINIC_OR_DEPARTMENT_OTHER)
Admission: RE | Disposition: A | Discharge status: Home / Self Care | Payer: Self-pay | Source: Home / Self Care | Attending: Obstetrics and Gynecology

## 2021-05-09 DIAGNOSIS — Z8616 Personal history of COVID-19: Secondary | ICD-10-CM | POA: Insufficient documentation

## 2021-05-09 DIAGNOSIS — N816 Rectocele: Secondary | ICD-10-CM | POA: Insufficient documentation

## 2021-05-09 DIAGNOSIS — N8110 Cystocele, unspecified: Secondary | ICD-10-CM | POA: Diagnosis not present

## 2021-05-09 DIAGNOSIS — Z780 Asymptomatic menopausal state: Secondary | ICD-10-CM | POA: Diagnosis not present

## 2021-05-09 DIAGNOSIS — N814 Uterovaginal prolapse, unspecified: Secondary | ICD-10-CM | POA: Diagnosis present

## 2021-05-09 DIAGNOSIS — M1990 Unspecified osteoarthritis, unspecified site: Secondary | ICD-10-CM | POA: Diagnosis not present

## 2021-05-09 HISTORY — DX: Cystocele, unspecified: N81.10

## 2021-05-09 HISTORY — DX: Unspecified osteoarthritis, unspecified site: M19.90

## 2021-05-09 HISTORY — DX: Stress incontinence (female) (male): N39.3

## 2021-05-09 HISTORY — PX: ANTERIOR AND POSTERIOR REPAIR: SHX5121

## 2021-05-09 HISTORY — DX: Presence of spectacles and contact lenses: Z97.3

## 2021-05-09 HISTORY — DX: Rectocele: N81.6

## 2021-05-09 LAB — CBC
HCT: 40.3 % (ref 36.0–46.0)
Hemoglobin: 13.3 g/dL (ref 12.0–15.0)
MCH: 30.8 pg (ref 26.0–34.0)
MCHC: 33 g/dL (ref 30.0–36.0)
MCV: 93.3 fL (ref 80.0–100.0)
Platelets: 236 10*3/uL (ref 150–400)
RBC: 4.32 MIL/uL (ref 3.87–5.11)
RDW: 12.8 % (ref 11.5–15.5)
WBC: 6.8 10*3/uL (ref 4.0–10.5)
nRBC: 0 % (ref 0.0–0.2)

## 2021-05-09 SURGERY — ANTERIOR (CYSTOCELE) AND POSTERIOR REPAIR (RECTOCELE)
Anesthesia: General | Site: Vagina

## 2021-05-09 MED ORDER — KETOROLAC TROMETHAMINE 30 MG/ML IJ SOLN
INTRAMUSCULAR | Status: AC
  Filled 2021-05-09: qty 1

## 2021-05-09 MED ORDER — DEXAMETHASONE SODIUM PHOSPHATE 10 MG/ML IJ SOLN
INTRAMUSCULAR | Status: DC | PRN
  Administered 2021-05-09: 4 mg via INTRAVENOUS

## 2021-05-09 MED ORDER — AMISULPRIDE (ANTIEMETIC) 5 MG/2ML IV SOLN: 10.0000 mg | Freq: Once | INTRAVENOUS | Status: DC | PRN

## 2021-05-09 MED ORDER — ONDANSETRON HCL 4 MG/2ML IJ SOLN
INTRAMUSCULAR | Status: DC | PRN
  Administered 2021-05-09: 4 mg via INTRAVENOUS

## 2021-05-09 MED ORDER — PROPOFOL 10 MG/ML IV BOLUS
INTRAVENOUS | Status: AC
  Filled 2021-05-09: qty 20

## 2021-05-09 MED ORDER — BISACODYL 10 MG RE SUPP: 10.0000 mg | Freq: Every day | RECTAL | Status: DC | PRN

## 2021-05-09 MED ORDER — ACETAMINOPHEN 500 MG PO TABS
ORAL_TABLET | ORAL | Status: AC
  Filled 2021-05-09: qty 2

## 2021-05-09 MED ORDER — DOCUSATE SODIUM 100 MG PO CAPS
ORAL_CAPSULE | ORAL | Status: AC
  Filled 2021-05-09: qty 1

## 2021-05-09 MED ORDER — MIDAZOLAM HCL 2 MG/2ML IJ SOLN
INTRAMUSCULAR | Status: AC
  Filled 2021-05-09: qty 2

## 2021-05-09 MED ORDER — PROPOFOL 1000 MG/100ML IV EMUL
INTRAVENOUS | Status: AC
  Filled 2021-05-09: qty 100

## 2021-05-09 MED ORDER — FENTANYL CITRATE (PF) 100 MCG/2ML IJ SOLN
INTRAMUSCULAR | Status: AC
  Filled 2021-05-09: qty 2

## 2021-05-09 MED ORDER — EPHEDRINE 5 MG/ML INJ
INTRAVENOUS | Status: AC
  Filled 2021-05-09: qty 5

## 2021-05-09 MED ORDER — ACETAMINOPHEN 10 MG/ML IV SOLN: 1000.0000 mg | Freq: Once | INTRAVENOUS | Status: AC | PRN

## 2021-05-09 MED ORDER — HYDROMORPHONE HCL 1 MG/ML IJ SOLN: 1.0000 mg | INTRAMUSCULAR | Status: DC | PRN

## 2021-05-09 MED ORDER — PROPOFOL 500 MG/50ML IV EMUL
INTRAVENOUS | Status: DC | PRN
Start: 2021-05-09 — End: 2021-05-09
  Administered 2021-05-09: 175 ug/kg/min via INTRAVENOUS

## 2021-05-09 MED ORDER — DEXAMETHASONE SODIUM PHOSPHATE 10 MG/ML IJ SOLN
INTRAMUSCULAR | Status: AC
  Filled 2021-05-09: qty 1

## 2021-05-09 MED ORDER — PROPOFOL 10 MG/ML IV BOLUS
INTRAVENOUS | Status: AC
Start: 2021-05-09 — End: ?
  Filled 2021-05-09: qty 20

## 2021-05-09 MED ORDER — SCOPOLAMINE 1 MG/3DAYS TD PT72: 1.0000 | MEDICATED_PATCH | TRANSDERMAL | Status: DC

## 2021-05-09 MED ORDER — MENTHOL 3 MG MT LOZG: 1.0000 | LOZENGE | OROMUCOSAL | Status: DC | PRN

## 2021-05-09 MED ORDER — PHENYLEPHRINE 40 MCG/ML (10ML) SYRINGE FOR IV PUSH (FOR BLOOD PRESSURE SUPPORT)
PREFILLED_SYRINGE | INTRAVENOUS | Status: DC | PRN
  Administered 2021-05-09: 40 ug via INTRAVENOUS
  Administered 2021-05-09: 80 ug via INTRAVENOUS
  Administered 2021-05-09 (×2): 40 ug via INTRAVENOUS

## 2021-05-09 MED ORDER — MIDAZOLAM HCL 2 MG/2ML IJ SOLN
INTRAMUSCULAR | Status: DC | PRN
Start: 2021-05-09 — End: 2021-05-09
  Administered 2021-05-09: 2 mg via INTRAVENOUS

## 2021-05-09 MED ORDER — DOCUSATE SODIUM 100 MG PO CAPS
100.0000 mg | ORAL_CAPSULE | Freq: Two times a day (BID) | ORAL | Status: DC
  Administered 2021-05-09 (×2): 100 mg via ORAL

## 2021-05-09 MED ORDER — ACETAMINOPHEN 325 MG PO TABS: 325.0000 mg | ORAL_TABLET | ORAL | Status: DC | PRN

## 2021-05-09 MED ORDER — OXYCODONE HCL 5 MG PO TABS: 5.0000 mg | ORAL_TABLET | Freq: Once | ORAL | Status: DC | PRN

## 2021-05-09 MED ORDER — ZOLPIDEM TARTRATE 5 MG PO TABS
ORAL_TABLET | ORAL | Status: AC
  Filled 2021-05-09: qty 1

## 2021-05-09 MED ORDER — DEXTROSE-NACL 5-0.45 % IV SOLN: INTRAVENOUS | Status: DC

## 2021-05-09 MED ORDER — ACETAMINOPHEN 160 MG/5ML PO SOLN: 325.0000 mg | ORAL | Status: DC | PRN

## 2021-05-09 MED ORDER — OXYCODONE HCL 5 MG/5ML PO SOLN: 5.0000 mg | Freq: Once | ORAL | Status: DC | PRN

## 2021-05-09 MED ORDER — LACTATED RINGERS IV SOLN: INTRAVENOUS | Status: DC

## 2021-05-09 MED ORDER — ALUM & MAG HYDROXIDE-SIMETH 200-200-20 MG/5ML PO SUSP: 30.0000 mL | ORAL | Status: DC | PRN

## 2021-05-09 MED ORDER — FENTANYL CITRATE (PF) 100 MCG/2ML IJ SOLN
INTRAMUSCULAR | Status: DC | PRN
  Administered 2021-05-09: 100 ug via INTRAVENOUS
  Administered 2021-05-09 (×4): 25 ug via INTRAVENOUS

## 2021-05-09 MED ORDER — FENTANYL CITRATE (PF) 100 MCG/2ML IJ SOLN: 25.0000 ug | INTRAMUSCULAR | Status: DC | PRN

## 2021-05-09 MED ORDER — 0.9 % SODIUM CHLORIDE (POUR BTL) OPTIME
TOPICAL | Status: DC | PRN
  Administered 2021-05-09: 500 mL

## 2021-05-09 MED ORDER — ONDANSETRON HCL 4 MG/2ML IJ SOLN
INTRAMUSCULAR | Status: AC
  Filled 2021-05-09: qty 2

## 2021-05-09 MED ORDER — LIDOCAINE 2% (20 MG/ML) 5 ML SYRINGE
INTRAMUSCULAR | Status: DC | PRN
  Administered 2021-05-09: 40 mg via INTRAVENOUS

## 2021-05-09 MED ORDER — CEFAZOLIN SODIUM-DEXTROSE 2-4 GM/100ML-% IV SOLN
2.0000 g | INTRAVENOUS | Status: AC
  Administered 2021-05-09: 2 g via INTRAVENOUS

## 2021-05-09 MED ORDER — ONDANSETRON HCL 4 MG/2ML IJ SOLN: 4.0000 mg | Freq: Four times a day (QID) | INTRAMUSCULAR | Status: DC | PRN

## 2021-05-09 MED ORDER — OXYCODONE HCL 5 MG PO TABS: 5.0000 mg | ORAL_TABLET | ORAL | Status: DC | PRN

## 2021-05-09 MED ORDER — PHENYLEPHRINE 40 MCG/ML (10ML) SYRINGE FOR IV PUSH (FOR BLOOD PRESSURE SUPPORT)
PREFILLED_SYRINGE | INTRAVENOUS | Status: AC
  Filled 2021-05-09: qty 10

## 2021-05-09 MED ORDER — ONDANSETRON HCL 4 MG PO TABS: 4.0000 mg | ORAL_TABLET | Freq: Four times a day (QID) | ORAL | Status: DC | PRN

## 2021-05-09 MED ORDER — PROPOFOL 10 MG/ML IV BOLUS
INTRAVENOUS | Status: DC | PRN
Start: 2021-05-09 — End: 2021-05-09
  Administered 2021-05-09: 200 mg via INTRAVENOUS
  Administered 2021-05-09: 50 mg via INTRAVENOUS

## 2021-05-09 MED ORDER — CEFAZOLIN SODIUM-DEXTROSE 2-4 GM/100ML-% IV SOLN
INTRAVENOUS | Status: AC
  Filled 2021-05-09: qty 100

## 2021-05-09 MED ORDER — ZOLPIDEM TARTRATE 5 MG PO TABS
5.0000 mg | ORAL_TABLET | Freq: Every evening | ORAL | Status: DC | PRN
  Administered 2021-05-09: 5 mg via ORAL

## 2021-05-09 MED ORDER — EPHEDRINE SULFATE-NACL 50-0.9 MG/10ML-% IV SOSY
PREFILLED_SYRINGE | INTRAVENOUS | Status: DC | PRN
  Administered 2021-05-09 (×2): 5 mg via INTRAVENOUS
  Administered 2021-05-09: 10 mg via INTRAVENOUS
  Administered 2021-05-09: 5 mg via INTRAVENOUS

## 2021-05-09 MED ORDER — ACETAMINOPHEN 500 MG PO TABS
ORAL_TABLET | ORAL | Status: AC
  Filled 2021-05-09: qty 1

## 2021-05-09 MED ORDER — IBUPROFEN 200 MG PO TABS: 600.0000 mg | ORAL_TABLET | Freq: Four times a day (QID) | ORAL | Status: DC

## 2021-05-09 MED ORDER — SUCCINYLCHOLINE CHLORIDE 200 MG/10ML IV SOSY
PREFILLED_SYRINGE | INTRAVENOUS | Status: DC | PRN
  Administered 2021-05-09 (×2): 40 mg via INTRAVENOUS

## 2021-05-09 MED ORDER — SIMETHICONE 80 MG PO CHEW: 80.0000 mg | CHEWABLE_TABLET | Freq: Four times a day (QID) | ORAL | Status: DC | PRN

## 2021-05-09 MED ORDER — KETOROLAC TROMETHAMINE 30 MG/ML IJ SOLN
30.0000 mg | Freq: Four times a day (QID) | INTRAMUSCULAR | Status: AC
  Administered 2021-05-09 – 2021-05-10 (×4): 30 mg via INTRAVENOUS

## 2021-05-09 MED ORDER — ACETAMINOPHEN 500 MG PO TABS
1000.0000 mg | ORAL_TABLET | Freq: Four times a day (QID) | ORAL | Status: DC
  Administered 2021-05-09 – 2021-05-10 (×4): 1000 mg via ORAL

## 2021-05-09 SURGICAL SUPPLY — 29 items
BLADE SURG 15 STRL LF DISP TIS (BLADE) IMPLANT
BLADE SURG 15 STRL SS (BLADE) ×4
CATH ROBINSON RED A/P 16FR (CATHETERS) ×2 IMPLANT
CLOTH BEACON ORANGE TIMEOUT ST (SAFETY) ×2 IMPLANT
DECANTER SPIKE VIAL GLASS SM (MISCELLANEOUS) IMPLANT
GAUZE 4X4 16PLY ~~LOC~~+RFID DBL (SPONGE) ×2 IMPLANT
GAUZE PACKING 2X5 YD STRL (GAUZE/BANDAGES/DRESSINGS) ×2 IMPLANT
GLOVE SURG ENC MOIS LTX SZ8 (GLOVE) IMPLANT
GLOVE SURG ORTHO LTX SZ7.5 (GLOVE) ×2 IMPLANT
GOWN STRL REUS W/TWL XL LVL3 (GOWN DISPOSABLE) ×2 IMPLANT
KIT TURNOVER CYSTO (KITS) ×2 IMPLANT
NDL MAYO CATGUT SZ4 TPR NDL (NEEDLE) IMPLANT
NEEDLE HYPO 22GX1.5 SAFETY (NEEDLE) IMPLANT
NEEDLE MAYO CATGUT SZ4 (NEEDLE) IMPLANT
NS IRRIG 1000ML POUR BTL (IV SOLUTION) ×1 IMPLANT
NS IRRIG 500ML POUR BTL (IV SOLUTION) ×1 IMPLANT
PACK VAGINAL WOMENS (CUSTOM PROCEDURE TRAY) ×2 IMPLANT
SURGILUBE 2OZ TUBE FLIPTOP (MISCELLANEOUS) ×1 IMPLANT
SUT PROLENE 1 CTX 30  8455H (SUTURE)
SUT PROLENE 1 CTX 30 8455H (SUTURE) IMPLANT
SUT SILK 0 SH 30 (SUTURE) IMPLANT
SUT VIC AB 2-0 CT2 27 (SUTURE) ×9 IMPLANT
SUT VIC AB 3-0 CT1 27 (SUTURE)
SUT VIC AB 3-0 CT1 TAPERPNT 27 (SUTURE) IMPLANT
SUT VIC AB 3-0 SH 27 (SUTURE) ×2
SUT VIC AB 3-0 SH 27X BRD (SUTURE) IMPLANT
SUT VICRYL 0 UR6 27IN ABS (SUTURE) IMPLANT
TOWEL OR 17X26 10 PK STRL BLUE (TOWEL DISPOSABLE) ×2 IMPLANT
TRAY FOLEY W/BAG SLVR 14FR LF (SET/KITS/TRAYS/PACK) ×2 IMPLANT

## 2021-05-09 NOTE — Anesthesia Postprocedure Evaluation (Signed)
Anesthesia Post Note ? ?Patient: Hailey Smith ? ?Procedure(s) Performed: ANTERIOR (CYSTOCELE) AND POSTERIOR REPAIR (RECTOCELE) (Vagina ) ? ?  ? ?Patient location during evaluation: PACU ?Anesthesia Type: General ?Level of consciousness: awake and alert ?Pain management: pain level controlled ?Vital Signs Assessment: post-procedure vital signs reviewed and stable ?Respiratory status: spontaneous breathing, nonlabored ventilation, respiratory function stable and patient connected to nasal cannula oxygen ?Cardiovascular status: blood pressure returned to baseline and stable ?Postop Assessment: no apparent nausea or vomiting ?Anesthetic complications: no ? ? ?No notable events documented. ? ?Last Vitals:  ?Vitals:  ? 05/09/21 0949 05/09/21 1000  ?BP:    ?Pulse: 72   ?Resp: 13   ?Temp:  (!) 36.4 ?C  ?SpO2: 99%   ?  ?Last Pain:  ?Vitals:  ? 05/09/21 1000  ?TempSrc:   ?PainSc: 0-No pain  ? ? ?  ?  ?  ?  ?  ?  ? ?Shelton Silvas ? ? ? ? ?

## 2021-05-09 NOTE — Op Note (Signed)
Preoperative diagnosis: Symptomatic rectocele and cystocele ?Postoperative diagnosis:  Same ?Procedure: Anterior and posterior repair  ?Surgeon: Lavina Hamman M.D. ?Asst.: Huel Cote, MD ?Anesthesia: General with LMA ?Findings: She had a Gr I cystocele and a grade II rectocele.  Assistance from Dr. Senaida Ores was necessary for adequate retraction and exposure ?estimated blood loss: 200 cc ?Complications: None ?Specimens: None ? ?Procedure in detail: ? ?The patient was taken to the operating room and placed in the dorsosupine position. General anesthesia was induced and her legs were placed in mobile stirrups. Perineum and vagina were then prepped and draped in usual sterile fashion and bladder drained with a Robinson catheter. A weighted speculum was placed in the vagina. The anterior vagina was grasped with 2 Allis clamps and a small horizontal piece of vaginal tissue was removed. The cystocele was then mobilized by dissecting anteriorly in the midline from the cervico-vaginal junction to within about 2 cm of the urethral meatus in the midline. Cystocele was mobilized laterally. Bleeding controlled with Bovie and 3-0 Vicryl.  Cystocele was then reduced with interrupted sutures of 2-0 Vicryl with adequate reduction. A small amount of excess vaginal tissue was removed and the incision was closed with running locking 2-0 Vicryl with adequate closure and adequate hemostasis. ? ?Attention was now turned to the rectocele repair. The hymenal ring was grasped with 2 Allis clamps at a distance that when brought together would still allow two fingers to  easily pass into the vagina. A horizontal piece of tissue was removed. The posterior vaginal mucosa was then dissected in the midline from the hymenal ring to the posterior cervico-vaginal junction. Rectocele was then mobilized bilaterally sharply and bluntly. The rectocele was then reduced with interrupted sutures of 2-0 Vicryl after a rectal exam confirmed this was  all rectocele. This achieved good reduction. A repeat rectal exam confirmed good reduction of the rectocele. Excess vaginal mucosa was then removed sharply. Vagina was closed from the vaginal apex to the hymenal ring with running locking 2-0 Vicryl.  This achieved adequate closure and adequate hemostasis. The vagina was then packed with 2 inch gauze coated with lubricant. A Foley catheter was also placed. The patient tolerated the procedure well. She was awakened and taken to the recovery in stable condition. Counts were correct, she had PAS hose on throughout the procedure. She received Ancef 2 g for surgical prophylaxis. ?

## 2021-05-09 NOTE — Anesthesia Preprocedure Evaluation (Addendum)
Anesthesia Evaluation  ?Patient identified by MRN, date of birth, ID band ?Patient awake ? ? ? ?Reviewed: ?Allergy & Precautions, NPO status , Patient's Chart, lab work & pertinent test results ? ?Airway ?Mallampati: II ? ?TM Distance: >3 FB ? ? ?Mouth opening: Limited Mouth Opening ? Dental ? ?(+) Teeth Intact, Dental Advisory Given ?  ?Pulmonary ?neg pulmonary ROS,  ?  ?breath sounds clear to auscultation ? ? ? ? ? ? Cardiovascular ?negative cardio ROS ? ? ?Rhythm:Regular Rate:Normal ? ? ?  ?Neuro/Psych ?negative neurological ROS ? negative psych ROS  ? GI/Hepatic ?negative GI ROS, Neg liver ROS,   ?Endo/Other  ?negative endocrine ROS ? Renal/GU ?negative Renal ROS  ? ?  ?Musculoskeletal ? ?(+) Arthritis ,  ? Abdominal ?Normal abdominal exam  (+)   ?Peds ? Hematology ?negative hematology ROS ?(+)   ?Anesthesia Other Findings ? ? Reproductive/Obstetrics ? ?  ? ? ? ? ? ? ? ? ? ? ? ? ? ?  ?  ? ? ? ? ? ? ? ?Anesthesia Physical ?Anesthesia Plan ? ?ASA: 3 ? ?Anesthesia Plan: General  ? ?Post-op Pain Management:   ? ?Induction: Intravenous ? ?PONV Risk Score and Plan: 4 or greater and Ondansetron, Dexamethasone, Midazolam, Scopolamine patch - Pre-op and TIVA ? ?Airway Management Planned: LMA ? ?Additional Equipment: None ? ?Intra-op Plan:  ? ?Post-operative Plan: Extubation in OR ? ?Informed Consent: I have reviewed the patients History and Physical, chart, labs and discussed the procedure including the risks, benefits and alternatives for the proposed anesthesia with the patient or authorized representative who has indicated his/her understanding and acceptance.  ? ? ? ?Dental advisory given ? ?Plan Discussed with: CRNA ? ?Anesthesia Plan Comments:   ? ? ? ? ? ?Anesthesia Quick Evaluation ? ?

## 2021-05-09 NOTE — Interval H&P Note (Signed)
History and Physical Interval Note: ? ?05/09/2021 ?7:18 AM ? ?Hailey Smith  has presented today for surgery, with the diagnosis of cystocele and rectocele.  The various methods of treatment have been discussed with the patient and family. After consideration of risks, benefits and other options for treatment, the patient has consented to  Procedure(s): ?ANTERIOR (CYSTOCELE) AND POSTERIOR REPAIR (RECTOCELE) (N/A) as a surgical intervention.  The patient's history has been reviewed, patient examined, no change in status, stable for surgery.  I have reviewed the patient's chart and labs.  Questions were answered to the patient's satisfaction.   ? ? ?Blane Ohara Refugia Laneve ? ? ?

## 2021-05-09 NOTE — Progress Notes (Signed)
Post-op check, s/p A&P repair ?Doing well, minimal pain, no n/v, ambulating currently ?Afeb, VSS ?Minimal blood, clear urine ?Continue routine care, I will remove packing and foley in am and evaluate for discharge ?

## 2021-05-09 NOTE — Anesthesia Procedure Notes (Signed)
Procedure Name: LMA Insertion ?Date/Time: 05/09/2021 7:31 AM ?Performed by: Eben Burow, CRNA ?Pre-anesthesia Checklist: Patient identified, Emergency Drugs available, Suction available, Patient being monitored and Timeout performed ?Patient Re-evaluated:Patient Re-evaluated prior to induction ?Oxygen Delivery Method: Circle system utilized ?Preoxygenation: Pre-oxygenation with 100% oxygen ?Induction Type: IV induction ?Ventilation: Mask ventilation without difficulty ?LMA: LMA inserted ?LMA Size: 4.0 ?Number of attempts: 1 ?Tube secured with: Tape ?Dental Injury: Teeth and Oropharynx as per pre-operative assessment  ?Comments: LMA placed by Dr Smith Robert ? ? ? ? ?

## 2021-05-09 NOTE — Transfer of Care (Signed)
Immediate Anesthesia Transfer of Care Note ? ?Patient: Hailey Smith ? ?Procedure(s) Performed: ANTERIOR (CYSTOCELE) AND POSTERIOR REPAIR (RECTOCELE) (Vagina ) ? ?Patient Location: PACU ? ?Anesthesia Type:General ? ?Level of Consciousness: drowsy and patient cooperative ? ?Airway & Oxygen Therapy: Patient Spontanous Breathing and Patient connected to face mask oxygen ? ?Post-op Assessment: Report given to RN and Post -op Vital signs reviewed and stable ? ?Post vital signs: Reviewed and stable ? ?Last Vitals:  ?Vitals Value Taken Time  ?BP 122/68 05/09/21 0900  ?Temp    ?Pulse 81 05/09/21 0906  ?Resp 16 05/09/21 0906  ?SpO2 97 % 05/09/21 0906  ?Vitals shown include unvalidated device data. ? ?Last Pain:  ?Vitals:  ? 05/09/21 0553  ?TempSrc: Oral  ?PainSc: 0-No pain  ?   ? ?  ? ?Complications: No notable events documented. ?

## 2021-05-10 DIAGNOSIS — N8110 Cystocele, unspecified: Secondary | ICD-10-CM | POA: Diagnosis not present

## 2021-05-10 LAB — CBC
HCT: 33.4 % — ABNORMAL LOW (ref 36.0–46.0)
Hemoglobin: 11.3 g/dL — ABNORMAL LOW (ref 12.0–15.0)
MCH: 31.4 pg (ref 26.0–34.0)
MCHC: 33.8 g/dL (ref 30.0–36.0)
MCV: 92.8 fL (ref 80.0–100.0)
Platelets: 210 10*3/uL (ref 150–400)
RBC: 3.6 MIL/uL — ABNORMAL LOW (ref 3.87–5.11)
RDW: 12.7 % (ref 11.5–15.5)
WBC: 11.9 10*3/uL — ABNORMAL HIGH (ref 4.0–10.5)
nRBC: 0 % (ref 0.0–0.2)

## 2021-05-10 MED ORDER — ACETAMINOPHEN 500 MG PO TABS
ORAL_TABLET | ORAL | Status: AC
  Filled 2021-05-10: qty 2

## 2021-05-10 MED ORDER — KETOROLAC TROMETHAMINE 30 MG/ML IJ SOLN
INTRAMUSCULAR | Status: AC
  Filled 2021-05-10: qty 1

## 2021-05-10 MED ORDER — OXYCODONE HCL 5 MG PO TABS: 5.0000 mg | ORAL_TABLET | ORAL | 0 refills | Status: AC | PRN

## 2021-05-10 MED ORDER — IBUPROFEN 600 MG PO TABS: 600.0000 mg | ORAL_TABLET | Freq: Four times a day (QID) | ORAL | 0 refills | Status: AC

## 2021-05-10 NOTE — Progress Notes (Signed)
1 Day Post-Op Procedure(s) (LRB): ?ANTERIOR (CYSTOCELE) AND POSTERIOR REPAIR (RECTOCELE) (N/A) ? ?Subjective: ?Patient reports tolerating PO.  No significant pain, slept well ?Objective: ?I have reviewed patient's vital signs, intake and output, and labs. ? ?General: alert ?GI: soft ?Vaginal Bleeding: minimal, vaginal packing removed ? ?Assessment: ?s/p Procedure(s): ?ANTERIOR (CYSTOCELE) AND POSTERIOR REPAIR (RECTOCELE) (N/A): stable, progressing well, and tolerating diet ? ?Plan: ?Advance diet ?Encourage ambulation ?Discontinue IV fluids ?Discharge home after voids ? LOS: 0 days  ? ? ?Leighton Roach Arleth Mccullar ?05/10/2021, 7:25 AM ? ? ? ? ?

## 2021-05-10 NOTE — Progress Notes (Signed)
Talked with Dr. Jackelyn Knife. In and Out cath was 675 cc. Okay to send patient home with instructions to not strain to void and if any problems occur to call office ?

## 2021-05-10 NOTE — Discharge Instructions (Addendum)
Routine instructions for colporrhaphy ? ? ? ?If unable to urinate and empty bladder call the office ?

## 2021-05-10 NOTE — Progress Notes (Signed)
Notified Dr. Jackelyn Knife that patient has had difficulty voiding this morning. Patient was able to void 200 cc but post void residual showed over 400 cc still in bladder via bladder scan. MD informed RN to watch patient for another hour and if unable to void, perform an In and Out cath, then patient can be discharged home.    ?

## 2021-05-13 ENCOUNTER — Encounter (HOSPITAL_BASED_OUTPATIENT_CLINIC_OR_DEPARTMENT_OTHER): Payer: Self-pay | Admitting: Obstetrics and Gynecology

## 2021-07-18 DIAGNOSIS — Z6828 Body mass index (BMI) 28.0-28.9, adult: Secondary | ICD-10-CM | POA: Diagnosis not present

## 2021-08-08 DIAGNOSIS — R7309 Other abnormal glucose: Secondary | ICD-10-CM | POA: Diagnosis not present

## 2021-08-08 DIAGNOSIS — E785 Hyperlipidemia, unspecified: Secondary | ICD-10-CM | POA: Diagnosis not present

## 2021-08-08 DIAGNOSIS — Z79899 Other long term (current) drug therapy: Secondary | ICD-10-CM | POA: Diagnosis not present

## 2021-08-19 DIAGNOSIS — Z0000 Encounter for general adult medical examination without abnormal findings: Secondary | ICD-10-CM | POA: Diagnosis not present

## 2021-08-19 DIAGNOSIS — E785 Hyperlipidemia, unspecified: Secondary | ICD-10-CM | POA: Diagnosis not present

## 2021-08-19 DIAGNOSIS — Z23 Encounter for immunization: Secondary | ICD-10-CM | POA: Diagnosis not present

## 2021-08-19 DIAGNOSIS — R7303 Prediabetes: Secondary | ICD-10-CM | POA: Diagnosis not present

## 2021-08-19 DIAGNOSIS — Z79899 Other long term (current) drug therapy: Secondary | ICD-10-CM | POA: Diagnosis not present

## 2021-08-26 ENCOUNTER — Other Ambulatory Visit: Payer: Self-pay | Admitting: Family Medicine

## 2021-08-26 DIAGNOSIS — E2839 Other primary ovarian failure: Secondary | ICD-10-CM

## 2021-09-17 ENCOUNTER — Ambulatory Visit
Admission: RE | Admit: 2021-09-17 | Discharge: 2021-09-17 | Disposition: A | Discharge status: Home / Self Care | Payer: Medicare (Managed Care) | Source: Ambulatory Visit | Attending: Family Medicine | Admitting: Family Medicine

## 2021-09-17 DIAGNOSIS — E2839 Other primary ovarian failure: Secondary | ICD-10-CM

## 2021-09-17 DIAGNOSIS — Z780 Asymptomatic menopausal state: Secondary | ICD-10-CM | POA: Diagnosis not present

## 2021-09-17 DIAGNOSIS — M85852 Other specified disorders of bone density and structure, left thigh: Secondary | ICD-10-CM | POA: Diagnosis not present

## 2021-09-20 DIAGNOSIS — R635 Abnormal weight gain: Secondary | ICD-10-CM | POA: Diagnosis not present

## 2021-12-03 DIAGNOSIS — R7303 Prediabetes: Secondary | ICD-10-CM | POA: Diagnosis not present

## 2021-12-03 DIAGNOSIS — Z79899 Other long term (current) drug therapy: Secondary | ICD-10-CM | POA: Diagnosis not present

## 2022-04-01 DIAGNOSIS — Z1211 Encounter for screening for malignant neoplasm of colon: Secondary | ICD-10-CM | POA: Diagnosis not present

## 2022-04-01 DIAGNOSIS — Z800 Family history of malignant neoplasm of digestive organs: Secondary | ICD-10-CM | POA: Diagnosis not present

## 2022-04-01 DIAGNOSIS — D122 Benign neoplasm of ascending colon: Secondary | ICD-10-CM | POA: Diagnosis not present

## 2022-04-01 DIAGNOSIS — D124 Benign neoplasm of descending colon: Secondary | ICD-10-CM | POA: Diagnosis not present

## 2022-04-01 DIAGNOSIS — D123 Benign neoplasm of transverse colon: Secondary | ICD-10-CM | POA: Diagnosis not present

## 2022-04-03 DIAGNOSIS — D124 Benign neoplasm of descending colon: Secondary | ICD-10-CM | POA: Diagnosis not present

## 2022-04-18 DIAGNOSIS — J22 Unspecified acute lower respiratory infection: Secondary | ICD-10-CM | POA: Diagnosis not present

## 2022-04-18 DIAGNOSIS — U071 COVID-19: Secondary | ICD-10-CM | POA: Diagnosis not present

## 2022-07-14 DIAGNOSIS — Z124 Encounter for screening for malignant neoplasm of cervix: Secondary | ICD-10-CM | POA: Diagnosis not present

## 2022-07-14 DIAGNOSIS — Z1231 Encounter for screening mammogram for malignant neoplasm of breast: Secondary | ICD-10-CM | POA: Diagnosis not present

## 2022-07-14 DIAGNOSIS — Z1389 Encounter for screening for other disorder: Secondary | ICD-10-CM | POA: Diagnosis not present

## 2022-07-14 DIAGNOSIS — Z1151 Encounter for screening for human papillomavirus (HPV): Secondary | ICD-10-CM | POA: Diagnosis not present

## 2022-07-14 DIAGNOSIS — Z01419 Encounter for gynecological examination (general) (routine) without abnormal findings: Secondary | ICD-10-CM | POA: Diagnosis not present

## 2022-07-31 DIAGNOSIS — D224 Melanocytic nevi of scalp and neck: Secondary | ICD-10-CM | POA: Diagnosis not present

## 2022-08-27 DIAGNOSIS — R7303 Prediabetes: Secondary | ICD-10-CM | POA: Diagnosis not present

## 2022-08-27 DIAGNOSIS — E785 Hyperlipidemia, unspecified: Secondary | ICD-10-CM | POA: Diagnosis not present

## 2022-08-27 DIAGNOSIS — Z79899 Other long term (current) drug therapy: Secondary | ICD-10-CM | POA: Diagnosis not present

## 2022-08-27 DIAGNOSIS — E559 Vitamin D deficiency, unspecified: Secondary | ICD-10-CM | POA: Diagnosis not present

## 2022-09-02 ENCOUNTER — Other Ambulatory Visit (HOSPITAL_BASED_OUTPATIENT_CLINIC_OR_DEPARTMENT_OTHER): Payer: Self-pay | Admitting: Family Medicine

## 2022-09-02 DIAGNOSIS — R7303 Prediabetes: Secondary | ICD-10-CM | POA: Diagnosis not present

## 2022-09-02 DIAGNOSIS — E559 Vitamin D deficiency, unspecified: Secondary | ICD-10-CM | POA: Diagnosis not present

## 2022-09-02 DIAGNOSIS — E785 Hyperlipidemia, unspecified: Secondary | ICD-10-CM | POA: Diagnosis not present

## 2022-09-02 DIAGNOSIS — M549 Dorsalgia, unspecified: Secondary | ICD-10-CM | POA: Diagnosis not present

## 2022-09-02 DIAGNOSIS — Z0000 Encounter for general adult medical examination without abnormal findings: Secondary | ICD-10-CM | POA: Diagnosis not present

## 2022-09-02 DIAGNOSIS — Z79899 Other long term (current) drug therapy: Secondary | ICD-10-CM | POA: Diagnosis not present

## 2022-10-13 ENCOUNTER — Ambulatory Visit (HOSPITAL_BASED_OUTPATIENT_CLINIC_OR_DEPARTMENT_OTHER)
Admission: RE | Admit: 2022-10-13 | Discharge: 2022-10-13 | Disposition: A | Discharge status: Home / Self Care | Payer: Self-pay | Source: Ambulatory Visit | Attending: Family Medicine | Admitting: Family Medicine

## 2022-10-13 DIAGNOSIS — E785 Hyperlipidemia, unspecified: Secondary | ICD-10-CM | POA: Insufficient documentation

## 2023-01-08 DIAGNOSIS — M79642 Pain in left hand: Secondary | ICD-10-CM | POA: Diagnosis not present

## 2023-01-08 DIAGNOSIS — E663 Overweight: Secondary | ICD-10-CM | POA: Diagnosis not present

## 2023-01-08 DIAGNOSIS — M154 Erosive (osteo)arthritis: Secondary | ICD-10-CM | POA: Diagnosis not present

## 2023-01-08 DIAGNOSIS — M19049 Primary osteoarthritis, unspecified hand: Secondary | ICD-10-CM | POA: Diagnosis not present

## 2023-01-08 DIAGNOSIS — Z6825 Body mass index (BMI) 25.0-25.9, adult: Secondary | ICD-10-CM | POA: Diagnosis not present

## 2023-01-08 DIAGNOSIS — M79641 Pain in right hand: Secondary | ICD-10-CM | POA: Diagnosis not present

## 2023-01-08 DIAGNOSIS — A421 Abdominal actinomycosis: Secondary | ICD-10-CM | POA: Diagnosis not present

## 2023-05-05 ENCOUNTER — Encounter: Payer: Self-pay | Admitting: Physical Therapy

## 2023-05-05 ENCOUNTER — Other Ambulatory Visit: Payer: Self-pay

## 2023-05-05 ENCOUNTER — Ambulatory Visit: Payer: Medicare (Managed Care) | Attending: Obstetrics and Gynecology | Admitting: Physical Therapy

## 2023-05-05 DIAGNOSIS — R293 Abnormal posture: Secondary | ICD-10-CM | POA: Diagnosis present

## 2023-05-05 DIAGNOSIS — M6281 Muscle weakness (generalized): Secondary | ICD-10-CM

## 2023-05-05 DIAGNOSIS — R279 Unspecified lack of coordination: Secondary | ICD-10-CM | POA: Diagnosis present

## 2023-05-05 NOTE — Therapy (Signed)
 OUTPATIENT PHYSICAL THERAPY FEMALE PELVIC EVALUATION   Patient Name: Hailey Smith MRN: [id number] DOB:[birth year], 68 y.o., female Today's Date: 05/05/2023  END OF SESSION:  PT End of Session - 05/05/23 1713     Visit Number 1    Number of Visits 8    Date for PT Re-Evaluation 11/02/23    Authorization Type [employer]age    PT Start Time 1400    PT Stop Time 1444    PT Time Calculation (min) 44 min    Activity Tolerance Patient tolerated treatment well    Behavior During Therapy WFL for tasks assessed/performed             Past Medical History:  Diagnosis Date   Cystocele with rectocele    History of COVID-19 07/2020   per pt mild symptoms that resolved   OA (osteoarthritis)    fingers   SUI (stress urinary incontinence, female)    Varicosities of leg 2011   w/ venous insuffeniency;  s/p laser ablation GSV and stab phlebectomy of right thigh, knee area, calf in 2011   Wears contact lenses    left eye only   Past Surgical History:  Procedure Laterality Date   ANTERIOR AND POSTERIOR REPAIR N/A 05/09/2021   Procedure: ANTERIOR (CYSTOCELE) AND POSTERIOR REPAIR (RECTOCELE);  Surgeon: Lavina Hamman, MD;  Location: [facility];  Service: Gynecology;  Laterality: N/A;   CATARACT EXTRACTION W/ INTRAOCULAR LENS IMPLANT Right 2020   EYE SURGERY Right 2020   virtrectomy post cataracty surgery ,   TONSILLECTOMY AND ADENOIDECTOMY     child   VARICOSE VEIN SURGERY Right 2011   by Dr Arbie Cookey;   laser ablation GSV and stab phlebectomy's of right thigh, knee, and calf area's   Patient Active Problem List   Diagnosis Date Noted   Cystocele with prolapse 05/09/2021   Rectocele 05/09/2021   Blood in urine 06/25/2017   Dysuria 06/25/2017   Menopausal symptom 06/25/2017    PCP: Shon Hale, MD  REFERRING PROVIDER: Lavina Hamman, MD  REFERRING DIAG: R15.9 (ICD-10-CM) - Full incontinence of feces  THERAPY DIAG:  Muscle weakness  (generalized)  Unspecified lack of coordination  Abnormal posture  Rationale for Evaluation and Treatment: Rehabilitation  ONSET DATE: 2023  SUBJECTIVE:                                                                                                                                                                                           SUBJECTIVE STATEMENT: Patient reports to PT for incontinence with urination and defecation that has been better as of recent. She had pelvic surgery in 2023  and her urinary incontinence symptoms have improved since. She was still experiencing fecal smearing/leakage until recently - she has started taking metamucil daily and is no longer having fecal incontinence. Fluid intake: drinks 10-30 oz water (less later in the day than earlier in the day), has 1 coffee/day   PAIN:  Are you having pain? No NPRS scale: 0/10  PRECAUTIONS: None  RED FLAGS: None   WEIGHT BEARING RESTRICTIONS: No  FALLS:  Has patient fallen in last 6 months? No  OCCUPATION: retired   ACTIVITY LEVEL : yoga 2x/wk, walking 2-3 days per week, belongs to [employer]   PLOF: Independent  PATIENT GOALS: to make sure that she has good control of pelvic floor muscles to keep things strong for the future   PERTINENT HISTORY:   05/09/2021 Procedure: ANTERIOR (CYSTOCELE) AND POSTERIOR REPAIR (RECTOCELE);  Surgeon: Lavina Hamman, MD;  Location: [facility];  Service: Gynecology;  Laterality: N/A;   Sexual abuse: No  BOWEL MOVEMENT: Pain with bowel movement: No Type of bowel movement:Type (Bristol Stool Scale) 4, Frequency every other day, Strain no, and Splinting no Fully empty rectum: Yes: since starting metamucil  Leakage: No Pads: No Fiber supplement/laxative Yes - metamucil   URINATION: Pain with urination: No Fully empty bladder: Yes:   Stream: Strong Urgency: Yes  Frequency: every 2 hours Leakage: none since surgery  Pads: No  INTERCOURSE:  Ability  to have vaginal penetration Yes  Pain with intercourse: Initial Penetration DrynessYes  Climax: yes Marinoff Scale: 1/3  PREGNANCY: Vaginal deliveries 3 Tearing Yes: cannot remember   PROLAPSE: None  OBJECTIVE:  Note: Objective measures were completed at Evaluation unless otherwise noted.  PATIENT SURVEYS:  PFIQ-7: assess at follow up visit   COGNITION: Overall cognitive status: Within functional limits for tasks assessed     SENSATION: Light touch: Appears intact  LUMBAR SPECIAL TESTS:  Single leg stance test: Positive  FUNCTIONAL TESTS:  Squat: bilateral dynamic knee valgus with weightbearing   GAIT: Comments: mild trendelenburg gait pattern with ambulation   POSTURE: rounded shoulders, forward head, and increased thoracic kyphosis   LUMBARAROM/PROM: within normal limits for all motions   A/PROM A/PROM  eval  Flexion   Extension   Right lateral flexion   Left lateral flexion   Right rotation   Left rotation    (Blank rows = not tested)  LOWER EXTREMITY ROM: within normal limits   Active ROM Right eval Left eval  Hip flexion    Hip extension    Hip abduction    Hip adduction    Hip internal rotation    Hip external rotation    Knee flexion    Knee extension    Ankle dorsiflexion    Ankle plantarflexion    Ankle inversion    Ankle eversion     (Blank rows = not tested)  LOWER EXTREMITY MMT: 4/5 bilateral knees and hips grossly   MMT Right eval Left eval  Hip flexion    Hip extension    Hip abduction    Hip adduction    Hip internal rotation    Hip external rotation    Knee flexion    Knee extension    Ankle dorsiflexion    Ankle plantarflexion    Ankle inversion    Ankle eversion     (Blank rows = not tested) PALPATION:  General: no pain with palpation of adductors/hip flexors   Pelvic Alignment: WNL  Abdominal: upper chest breathing, abdominal bracing at rest  External Perineal Exam: dryness noted, patient  able to bulge/contract pelvic floor via external observation                             Internal Pelvic Floor: no significant weakness noted throughout. No pain with palpation of pelvic floor musculature layers 1/2/3. Lack of coordination between diaphragm and pelvic floor found with breathing.  Patient confirms identification and approves PT to assess internal pelvic floor and treatment Yes No emotional/communication barriers or cognitive limitation. Patient is motivated to learn. Patient understands and agrees with treatment goals and plan. PT explains patient will be examined in standing, sitting, and lying down to see how their muscles and joints work. When they are ready, they will be asked to remove their underwear so PT can examine their perineum. The patient is also given the option of providing their own chaperone as one is not provided in our facility. The patient also has the right and is explained the right to defer or refuse any part of the evaluation or treatment including the internal exam. With the patient's consent, PT will use one gloved finger to gently assess the muscles of the pelvic floor, seeing how well it contracts and relaxes and if there is muscle symmetry. After, the patient will get dressed and PT and patient will discuss exam findings and plan of care. PT and patient discuss plan of care, schedule, attendance policy and HEP activities.  PELVIC MMT:   MMT eval  Vaginal 4/5, 10 quick flicks, 10 sec hold  Internal Anal Sphincter   External Anal Sphincter   Puborectalis   Diastasis Recti   (Blank rows = not tested)       TONE: WNL  PROLAPSE: N/A  TODAY'S TREATMENT:                                                                                                                              DATE:   EVAL 05/05/23: Examination completed, findings reviewed, pt educated on POC, HEP, and self care. Pt motivated to participate in PT and agreeable to attempt recommendations.    Neuro re-ed:  Hooklying diaphragmatic breathing with pelvic floor lengthening and shortening during inhalation and exhalation 2x10  Hooklying quick flicks for pelvic floor control 3x10  Self care: Relative anatomy and connection between pelvic floor and diaphragm Education surrounding bowel habits and breathing for bowel movements   PATIENT EDUCATION:  Education details: Relative anatomy and connection between pelvic floor and diaphragm, Education surrounding bowel habits and breathing for bowel movements Person educated: Patient Education method: Explanation, Demonstration, Tactile cues, Verbal cues, and Handouts Education comprehension: verbalized understanding, returned demonstration, verbal cues required, and tactile cues required  HOME EXERCISE PROGRAM: Access Code: [email or web address] URL: [email or web address] Date: 05/05/2023 Prepared by: Earna Coder  Exercises - Supine Pelvic Floor Contraction  - 1 x daily - 7 x weekly - 3 sets - 10 reps - Quick  Flick Pelvic Floor Contractions in Hooklying  - 1 x daily - 7 x weekly - 3 sets - 10 reps  ASSESSMENT:  CLINICAL IMPRESSION: Patient is a 68 y.o. female  who was seen today for physical therapy evaluation and treatment for fecal incontinence. Patient had surgery for anterior and posterior prolapse repair 05/09/21, and since then, her urinary symptoms have resolved. Her fecal smearing symptoms have also resolved since starting metamucil on a daily basis. She wishes to ensure that this issue does not return and learn more about her pelvic floor and how to control the musculature. Pt fully consented to today's internal examination which revealed no significant weakness, however, she did not have full active range of motion of her pelvic floor with inhalation/exhalation. With internal and verbal cueing, patient was able to actively lengthen her pelvic floor with inhalation. No pain following today's session, Pt would benefit from additional  PT to further address deficits.   OBJECTIVE IMPAIRMENTS: decreased coordination, decreased endurance, decreased ROM, and decreased strength.   ACTIVITY LIMITATIONS: continence  PARTICIPATION LIMITATIONS:  none  PERSONAL FACTORS: Past/current experiences and Time since onset of injury/illness/exacerbation are also affecting patient's functional outcome.   REHAB POTENTIAL: Good  CLINICAL DECISION MAKING: Stable/uncomplicated  EVALUATION COMPLEXITY: Low   GOALS: Goals reviewed with patient? Yes  SHORT TERM GOALS: Target date: 06/02/2023  Pt will be independent with HEP.  Baseline: Goal status: INITIAL  2.  Pt will be independent with diaphragmatic breathing and down training activities in order to improve pelvic floor relaxation and active range of motion. Baseline:  Goal status: INITIAL  3.  Pt will be independent with use of squatty potty, relaxed toileting mechanics, and improved bowel movement techniques in order to increase ease of bowel movements and complete evacuation.   Baseline:  Goal status: INITIAL  LONG TERM GOALS: Target date: 11/02/2023  Pt will be independent with advanced HEP.  Baseline:  Goal status: INITIAL  2.  Pt to demonstrate improved coordination of pelvic floor and breathing mechanics with 10# squat with appropriate synergistic patterns to decrease pain and fecal leakage at least 75% of the time.   Baseline:  Goal status: INITIAL  3.  Pt will demonstrate normal pelvic floor muscle tone and A/ROM, able to achieve 5/5 strength with contractions and 10 sec endurance, in order to provide appropriate lumbopelvic support in functional activities.   Baseline:  Goal status: INITIAL  4.  Pt will report her BMs are complete due to improved bowel habits and evacuation techniques.  Baseline:  Goal status: INITIAL  PLAN:  PT FREQUENCY: 1x/week  PT DURATION: 8 weeks  PLANNED INTERVENTIONS: 97110-Therapeutic exercises, 97530- Therapeutic activity, 97112-  Neuromuscular re-education, 97535- Self Care, [id number]- Manual therapy, Taping, Dry Needling, Joint mobilization, Spinal mobilization, Scar mobilization, Cryotherapy, and Moist heat  PLAN FOR NEXT SESSION: progress pelvic AROM to seated, introduce movement with pelvic floor control    Omar Person, PT 05/05/2023, 5:14 PM

## 2023-05-06 DIAGNOSIS — M13841 Other specified arthritis, right hand: Secondary | ICD-10-CM | POA: Diagnosis not present

## 2023-05-12 ENCOUNTER — Ambulatory Visit: Payer: Medicare (Managed Care) | Attending: Obstetrics and Gynecology | Admitting: Physical Therapy

## 2023-05-12 DIAGNOSIS — M6281 Muscle weakness (generalized): Secondary | ICD-10-CM | POA: Diagnosis not present

## 2023-05-12 DIAGNOSIS — R279 Unspecified lack of coordination: Secondary | ICD-10-CM | POA: Insufficient documentation

## 2023-05-12 DIAGNOSIS — R293 Abnormal posture: Secondary | ICD-10-CM | POA: Diagnosis not present

## 2023-05-12 NOTE — Therapy (Signed)
 OUTPATIENT PHYSICAL THERAPY FEMALE PELVIC DISCHARGE   Patient Name: Hailey Smith MRN: [id number] DOB:January 02, 1956, 68 y.o., female Today's Date: 05/12/2023  END OF SESSION:  PT End of Session - 05/12/23 1534     Visit Number 2    Number of Visits 8    Date for PT Re-Evaluation 11/02/23    Authorization Type [employer]    PT Start Time 0245    PT Stop Time 0310    PT Time Calculation (min) 25 min    Activity Tolerance Patient tolerated treatment well    Behavior During Therapy [facility] for tasks assessed/performed              Past Medical History:  Diagnosis Date   Cystocele with rectocele    History of COVID-19 07/2020   per pt mild symptoms that resolved   OA (osteoarthritis)    fingers   SUI (stress urinary incontinence, female)    Varicosities of leg 2011   w/ venous insuffeniency;  s/p laser ablation GSV and stab phlebectomy of right thigh, knee area, calf in 2011   Wears contact lenses    left eye only   Past Surgical History:  Procedure Laterality Date   ANTERIOR AND POSTERIOR REPAIR N/A 05/09/2021   Procedure: ANTERIOR (CYSTOCELE) AND POSTERIOR REPAIR (RECTOCELE);  Surgeon: Lavina Hamman, MD;  Location: [facility];  Service: Gynecology;  Laterality: N/A;   CATARACT EXTRACTION W/ INTRAOCULAR LENS IMPLANT Right 2020   EYE SURGERY Right 2020   virtrectomy post cataracty surgery ,   TONSILLECTOMY AND ADENOIDECTOMY     child   VARICOSE VEIN SURGERY Right 2011   by Dr Arbie Cookey;   laser ablation GSV and stab phlebectomy's of right thigh, knee, and calf area's   Patient Active Problem List   Diagnosis Date Noted   Cystocele with prolapse 05/09/2021   Rectocele 05/09/2021   Blood in urine 06/25/2017   Dysuria 06/25/2017   Menopausal symptom 06/25/2017    PCP: Shon Hale, MD  REFERRING PROVIDER: Lavina Hamman, MD  REFERRING DIAG: R15.9 (ICD-10-CM) - Full incontinence of feces  THERAPY DIAG:  Muscle weakness  (generalized)  Unspecified lack of coordination  Abnormal posture  Rationale for Evaluation and Treatment: Rehabilitation  ONSET DATE: 2023  SUBJECTIVE:                                                                                                                                                                                           SUBJECTIVE STATEMENT: Patient reports that she is doing well today, she has been doing her exercises consistently and has been successful. She has not  had any fecal or urinary incontinence, she is pleased with her current regime and is amenable to discharge today.   Eval:  Patient reports to PT for incontinence with urination and defecation that has been better as of recent. She had pelvic surgery in 2023 and her urinary incontinence symptoms have improved since. She was still experiencing fecal smearing/leakage until recently - she has started taking metamucil daily and is no longer having fecal incontinence. Fluid intake: drinks 10-30 oz water (less later in the day than earlier in the day), has 1 coffee/day   PAIN:  Are you having pain? No NPRS scale: 0/10  PRECAUTIONS: None  RED FLAGS: None   WEIGHT BEARING RESTRICTIONS: No  FALLS:  Has patient fallen in last 6 months? No  OCCUPATION: retired   ACTIVITY LEVEL : yoga 2x/wk, walking 2-3 days per week, belongs to [employer]   PLOF: Independent  PATIENT GOALS: to make sure that she has good control of pelvic floor muscles to keep things strong for the future   PERTINENT HISTORY:   05/09/2021 Procedure: ANTERIOR (CYSTOCELE) AND POSTERIOR REPAIR (RECTOCELE);  Surgeon: Lavina Hamman, MD;  Location: [facility];  Service: Gynecology;  Laterality: N/A;   Sexual abuse: No  BOWEL MOVEMENT: Pain with bowel movement: No Type of bowel movement:Type (Bristol Stool Scale) 4, Frequency every other day, Strain no, and Splinting no Fully empty rectum: Yes: since starting metamucil   Leakage: No Pads: No Fiber supplement/laxative Yes - metamucil   URINATION: Pain with urination: No Fully empty bladder: Yes:   Stream: Strong Urgency: Yes  Frequency: every 2 hours Leakage: none since surgery  Pads: No  INTERCOURSE:  Ability to have vaginal penetration Yes  Pain with intercourse: Initial Penetration DrynessYes  Climax: yes Marinoff Scale: 1/3  PREGNANCY: Vaginal deliveries 3 Tearing Yes: cannot remember   PROLAPSE: None  OBJECTIVE:  Note: Objective measures were completed at Evaluation unless otherwise noted.  PATIENT SURVEYS:  PFIQ-7: assess at follow up visit   COGNITION: Overall cognitive status: Within functional limits for tasks assessed     SENSATION: Light touch: Appears intact  LUMBAR SPECIAL TESTS:  Single leg stance test: Positive  FUNCTIONAL TESTS:  Squat: bilateral dynamic knee valgus with weightbearing   GAIT: Comments: mild trendelenburg gait pattern with ambulation   POSTURE: rounded shoulders, forward head, and increased thoracic kyphosis   LUMBARAROM/PROM: within normal limits for all motions   A/PROM A/PROM  eval  Flexion   Extension   Right lateral flexion   Left lateral flexion   Right rotation   Left rotation    (Blank rows = not tested)  LOWER EXTREMITY ROM: within normal limits   Active ROM Right eval Left eval  Hip flexion    Hip extension    Hip abduction    Hip adduction    Hip internal rotation    Hip external rotation    Knee flexion    Knee extension    Ankle dorsiflexion    Ankle plantarflexion    Ankle inversion    Ankle eversion     (Blank rows = not tested)  LOWER EXTREMITY MMT: 4/5 bilateral knees and hips grossly   MMT Right eval Left eval  Hip flexion    Hip extension    Hip abduction    Hip adduction    Hip internal rotation    Hip external rotation    Knee flexion    Knee extension    Ankle dorsiflexion    Ankle  plantarflexion    Ankle inversion    Ankle eversion      (Blank rows = not tested) PALPATION:  General: no pain with palpation of adductors/hip flexors   Pelvic Alignment: WNL  Abdominal: upper chest breathing, abdominal bracing at rest                External Perineal Exam: dryness noted, patient able to bulge/contract pelvic floor via external observation                             Internal Pelvic Floor: no significant weakness noted throughout. No pain with palpation of pelvic floor musculature layers 1/2/3. Lack of coordination between diaphragm and pelvic floor found with breathing.  Patient confirms identification and approves PT to assess internal pelvic floor and treatment Yes No emotional/communication barriers or cognitive limitation. Patient is motivated to learn. Patient understands and agrees with treatment goals and plan. PT explains patient will be examined in standing, sitting, and lying down to see how their muscles and joints work. When they are ready, they will be asked to remove their underwear so PT can examine their perineum. The patient is also given the option of providing their own chaperone as one is not provided in our facility. The patient also has the right and is explained the right to defer or refuse any part of the evaluation or treatment including the internal exam. With the patient's consent, PT will use one gloved finger to gently assess the muscles of the pelvic floor, seeing how well it contracts and relaxes and if there is muscle symmetry. After, the patient will get dressed and PT and patient will discuss exam findings and plan of care. PT and patient discuss plan of care, schedule, attendance policy and HEP activities.  PELVIC MMT:   MMT eval  Vaginal 4/5, 10 quick flicks, 10 sec hold  Internal Anal Sphincter   External Anal Sphincter   Puborectalis   Diastasis Recti   (Blank rows = not tested)       TONE: WNL  PROLAPSE: N/A  TODAY'S TREATMENT:                                                                                                                               DATE:   EVAL 05/05/23: Examination completed, findings reviewed, pt educated on POC, HEP, and self care. Pt motivated to participate in PT and agreeable to attempt recommendations.   Neuro re-ed:  Hooklying diaphragmatic breathing with pelvic floor lengthening and shortening during inhalation and exhalation 2x10  Hooklying quick flicks for pelvic floor control 3x10  Self care: Relative anatomy and connection between pelvic floor and diaphragm Education surrounding bowel habits and breathing for bowel movements   05/12/23: Neuro re-ed:  Hooklying diaphragmatic breathing with pelvic floor lengthening and shortening during inhalation and exhalation 2x10  Hooklying quick flicks for pelvic floor control 3x10  Self  care: Relative anatomy and connection between pelvic floor and diaphragm Education surrounding bowel habits and breathing for bowel movements  Progression of pelvic floor training to seated/standing, addition of 3 functional pelvic floor training exercises to HEP for continuation of HEP independently   PATIENT EDUCATION:  Education details: Relative anatomy and connection between pelvic floor and diaphragm, Education surrounding bowel habits and breathing for bowel movements Person educated: Patient Education method: Explanation, Demonstration, Tactile cues, Verbal cues, and Handouts Education comprehension: verbalized understanding, returned demonstration, verbal cues required, and tactile cues required  HOME EXERCISE PROGRAM: Access Code: [email or web address] URL: [email or web address] Date: 05/05/2023 Prepared by: Earna Coder  Exercises - Supine Pelvic Floor Contraction  - 1 x daily - 7 x weekly - 3 sets - 10 reps - Quick Flick Pelvic Floor Contractions in Hooklying  - 1 x daily - 7 x weekly - 3 sets - 10 reps  ASSESSMENT:  CLINICAL IMPRESSION: Patient is a 69 y.o. female  who was seen today for  physical therapy evaluation and treatment for fecal incontinence. Patient had surgery for anterior and posterior prolapse repair 05/09/21, and since then, her urinary symptoms have resolved. Her fecal smearing symptoms have also resolved since starting metamucil on a daily basis. Patient demonstrates an understanding of pelvic floor AROM and has progressive exercises to continue independently. All goals have been met and patient is appropriate to discharge to HEP at this time.   OBJECTIVE IMPAIRMENTS: decreased coordination, decreased endurance, decreased ROM, and decreased strength.   ACTIVITY LIMITATIONS: continence  PARTICIPATION LIMITATIONS:  none  PERSONAL FACTORS: Past/current experiences and Time since onset of injury/illness/exacerbation are also affecting patient's functional outcome.   REHAB POTENTIAL: Good  CLINICAL DECISION MAKING: Stable/uncomplicated  EVALUATION COMPLEXITY: Low   GOALS: Goals reviewed with patient? Yes  SHORT TERM GOALS: Target date: 06/02/2023  Pt will be independent with HEP.  Baseline: Goal status: GOAL MET   2.  Pt will be independent with diaphragmatic breathing and down training activities in order to improve pelvic floor relaxation and active range of motion. Baseline:  Goal status: GOAL MET   3.  Pt will be independent with use of squatty potty, relaxed toileting mechanics, and improved bowel movement techniques in order to increase ease of bowel movements and complete evacuation.   Baseline:  Goal status: GOAL MET   LONG TERM GOALS: Target date: 11/02/2023  Pt will be independent with advanced HEP.  Baseline:  Goal status: GOAL MET   2.  Pt to demonstrate improved coordination of pelvic floor and breathing mechanics with 10# squat with appropriate synergistic patterns to decrease pain and fecal leakage at least 75% of the time.   Baseline:  Goal status: GOAL MET   3.  Pt will demonstrate normal pelvic floor muscle tone and A/ROM, able to  achieve 5/5 strength with contractions and 10 sec endurance, in order to provide appropriate lumbopelvic support in functional activities.   Baseline:  Goal status: GOAL MET   4.  Pt will report her BMs are complete due to improved bowel habits and evacuation techniques.  Baseline:  Goal status: GOAL MET   PLAN:  PHYSICAL THERAPY DISCHARGE SUMMARY  Visits from Start of Care: 2  Current functional level related to goals / functional outcomes: See above   Remaining deficits: See above (none)   Education / Equipment: See above   Patient agrees to discharge. Patient goals were met. Patient is being discharged due to being pleased with the current functional level.   Akesha Uresti C  Rajanae Mantia, PT 05/12/2023, 3:35 PM

## 2023-05-27 ENCOUNTER — Ambulatory Visit: Payer: Medicare (Managed Care) | Admitting: Physical Therapy

## 2023-06-02 ENCOUNTER — Encounter: Payer: Medicare (Managed Care) | Admitting: Physical Therapy

## 2023-06-09 ENCOUNTER — Encounter: Payer: Medicare (Managed Care) | Admitting: Physical Therapy

## 2023-06-16 ENCOUNTER — Encounter: Payer: Medicare (Managed Care) | Admitting: Physical Therapy

## 2023-06-23 ENCOUNTER — Encounter: Payer: Medicare (Managed Care) | Admitting: Physical Therapy

## 2023-06-30 ENCOUNTER — Encounter: Payer: Medicare (Managed Care) | Admitting: Physical Therapy

## 2023-06-30 DIAGNOSIS — Z0100 Encounter for examination of eyes and vision without abnormal findings: Secondary | ICD-10-CM | POA: Diagnosis not present

## 2023-07-20 DIAGNOSIS — M13841 Other specified arthritis, right hand: Secondary | ICD-10-CM | POA: Diagnosis not present

## 2023-07-21 DIAGNOSIS — Z6826 Body mass index (BMI) 26.0-26.9, adult: Secondary | ICD-10-CM | POA: Diagnosis not present

## 2023-07-21 DIAGNOSIS — Z1231 Encounter for screening mammogram for malignant neoplasm of breast: Secondary | ICD-10-CM | POA: Diagnosis not present

## 2023-07-21 DIAGNOSIS — Z01419 Encounter for gynecological examination (general) (routine) without abnormal findings: Secondary | ICD-10-CM | POA: Diagnosis not present

## 2023-10-06 DIAGNOSIS — E785 Hyperlipidemia, unspecified: Secondary | ICD-10-CM | POA: Diagnosis not present

## 2023-10-06 DIAGNOSIS — Z79899 Other long term (current) drug therapy: Secondary | ICD-10-CM | POA: Diagnosis not present

## 2023-10-06 DIAGNOSIS — E559 Vitamin D deficiency, unspecified: Secondary | ICD-10-CM | POA: Diagnosis not present

## 2023-10-06 DIAGNOSIS — R7303 Prediabetes: Secondary | ICD-10-CM | POA: Diagnosis not present

## 2023-10-06 DIAGNOSIS — M13841 Other specified arthritis, right hand: Secondary | ICD-10-CM | POA: Diagnosis not present

## 2023-10-08 DIAGNOSIS — Z79899 Other long term (current) drug therapy: Secondary | ICD-10-CM | POA: Diagnosis not present

## 2023-10-08 DIAGNOSIS — E559 Vitamin D deficiency, unspecified: Secondary | ICD-10-CM | POA: Diagnosis not present

## 2023-10-08 DIAGNOSIS — R7303 Prediabetes: Secondary | ICD-10-CM | POA: Diagnosis not present

## 2023-10-08 DIAGNOSIS — R030 Elevated blood-pressure reading, without diagnosis of hypertension: Secondary | ICD-10-CM | POA: Diagnosis not present

## 2023-10-08 DIAGNOSIS — Z0000 Encounter for general adult medical examination without abnormal findings: Secondary | ICD-10-CM | POA: Diagnosis not present

## 2023-10-08 DIAGNOSIS — E785 Hyperlipidemia, unspecified: Secondary | ICD-10-CM | POA: Diagnosis not present

## 2023-10-22 DIAGNOSIS — Z6825 Body mass index (BMI) 25.0-25.9, adult: Secondary | ICD-10-CM | POA: Diagnosis not present

## 2023-10-22 DIAGNOSIS — Z0130 Encounter for examination of blood pressure without abnormal findings: Secondary | ICD-10-CM | POA: Diagnosis not present

## 2023-12-15 ENCOUNTER — Other Ambulatory Visit (HOSPITAL_BASED_OUTPATIENT_CLINIC_OR_DEPARTMENT_OTHER): Payer: Self-pay

## 2023-12-15 MED ORDER — COMIRNATY 30 MCG/0.3ML IM SUSY
0.3000 mL | PREFILLED_SYRINGE | Freq: Once | INTRAMUSCULAR | 0 refills | Status: AC
  Filled 2023-12-15: qty 0.3, 1d supply, fill #0

## 2023-12-15 MED ORDER — FLUZONE HIGH-DOSE 0.5 ML IM SUSY
0.5000 mL | PREFILLED_SYRINGE | Freq: Once | INTRAMUSCULAR | 0 refills | Status: AC
  Filled 2023-12-15: qty 0.5, 1d supply, fill #0

## 2024-02-10 DIAGNOSIS — Z6823 Body mass index (BMI) 23.0-23.9, adult: Secondary | ICD-10-CM | POA: Diagnosis not present
